# Patient Record
Sex: Female | Born: 1945
Health system: Southern US, Community
[De-identification: ages and names within clinical notes are randomized; demographics above are authoritative.]

## PROBLEM LIST (undated history)

## (undated) DIAGNOSIS — M1990 Unspecified osteoarthritis, unspecified site: Secondary | ICD-10-CM

## (undated) DIAGNOSIS — F419 Anxiety disorder, unspecified: Secondary | ICD-10-CM

## (undated) DIAGNOSIS — E7800 Pure hypercholesterolemia, unspecified: Secondary | ICD-10-CM

## (undated) DIAGNOSIS — E039 Hypothyroidism, unspecified: Secondary | ICD-10-CM

## (undated) DIAGNOSIS — K219 Gastro-esophageal reflux disease without esophagitis: Secondary | ICD-10-CM

## (undated) DIAGNOSIS — I10 Essential (primary) hypertension: Secondary | ICD-10-CM

## (undated) HISTORY — PX: ABDOMINAL HYSTERECTOMY: SHX81

---

## 1999-10-04 ENCOUNTER — Encounter: Admission: RE | Admit: 1999-10-04 | Discharge: 1999-10-04 | Payer: Self-pay | Admitting: Internal Medicine

## 2000-10-14 ENCOUNTER — Encounter: Admission: RE | Admit: 2000-10-14 | Discharge: 2000-10-14 | Payer: Self-pay | Admitting: Internal Medicine

## 2000-10-14 ENCOUNTER — Encounter: Payer: Self-pay | Admitting: Internal Medicine

## 2001-04-16 ENCOUNTER — Encounter: Admission: RE | Admit: 2001-04-16 | Discharge: 2001-04-16 | Payer: Self-pay | Admitting: Internal Medicine

## 2001-04-16 ENCOUNTER — Encounter: Payer: Self-pay | Admitting: Internal Medicine

## 2001-11-25 ENCOUNTER — Encounter: Admission: RE | Admit: 2001-11-25 | Discharge: 2001-11-25 | Payer: Self-pay | Admitting: Internal Medicine

## 2001-11-25 ENCOUNTER — Encounter: Payer: Self-pay | Admitting: Internal Medicine

## 2002-06-29 ENCOUNTER — Encounter: Admission: RE | Admit: 2002-06-29 | Discharge: 2002-06-29 | Payer: Self-pay | Admitting: Internal Medicine

## 2002-06-29 ENCOUNTER — Encounter: Payer: Self-pay | Admitting: Internal Medicine

## 2003-01-04 ENCOUNTER — Encounter: Admission: RE | Admit: 2003-01-04 | Discharge: 2003-01-04 | Payer: Self-pay | Admitting: Internal Medicine

## 2003-01-04 ENCOUNTER — Encounter: Payer: Self-pay | Admitting: Internal Medicine

## 2003-12-15 ENCOUNTER — Ambulatory Visit (HOSPITAL_COMMUNITY): Admission: RE | Admit: 2003-12-15 | Discharge: 2003-12-15 | Payer: Self-pay | Admitting: Internal Medicine

## 2004-02-14 ENCOUNTER — Encounter: Admission: RE | Admit: 2004-02-14 | Discharge: 2004-02-14 | Payer: Self-pay | Admitting: Internal Medicine

## 2005-02-14 ENCOUNTER — Encounter: Admission: RE | Admit: 2005-02-14 | Discharge: 2005-02-14 | Payer: Self-pay | Admitting: Internal Medicine

## 2005-11-28 ENCOUNTER — Emergency Department (HOSPITAL_COMMUNITY): Admission: EM | Admit: 2005-11-28 | Discharge: 2005-11-29 | Payer: Self-pay | Admitting: Emergency Medicine

## 2006-01-24 ENCOUNTER — Encounter: Admission: RE | Admit: 2006-01-24 | Discharge: 2006-01-24 | Payer: Self-pay | Admitting: Internal Medicine

## 2006-02-19 ENCOUNTER — Encounter: Admission: RE | Admit: 2006-02-19 | Discharge: 2006-02-19 | Payer: Self-pay | Admitting: Internal Medicine

## 2007-02-04 ENCOUNTER — Encounter: Admission: RE | Admit: 2007-02-04 | Discharge: 2007-02-04 | Payer: Self-pay | Admitting: Family Medicine

## 2007-02-20 ENCOUNTER — Encounter: Admission: RE | Admit: 2007-02-20 | Discharge: 2007-02-20 | Payer: Self-pay | Admitting: Family Medicine

## 2007-04-06 ENCOUNTER — Other Ambulatory Visit: Admission: RE | Admit: 2007-04-06 | Discharge: 2007-04-06 | Payer: Self-pay | Admitting: Family Medicine

## 2008-02-26 ENCOUNTER — Encounter: Admission: RE | Admit: 2008-02-26 | Discharge: 2008-02-26 | Payer: Self-pay | Admitting: Family Medicine

## 2008-08-16 ENCOUNTER — Ambulatory Visit (HOSPITAL_COMMUNITY): Admission: RE | Admit: 2008-08-16 | Discharge: 2008-08-16 | Payer: Self-pay | Admitting: Gastroenterology

## 2008-08-16 ENCOUNTER — Encounter (INDEPENDENT_AMBULATORY_CARE_PROVIDER_SITE_OTHER): Payer: Self-pay | Admitting: Gastroenterology

## 2009-02-27 ENCOUNTER — Encounter: Admission: RE | Admit: 2009-02-27 | Discharge: 2009-02-27 | Payer: Self-pay | Admitting: Family Medicine

## 2009-06-27 ENCOUNTER — Other Ambulatory Visit: Admission: RE | Admit: 2009-06-27 | Discharge: 2009-06-27 | Payer: Self-pay | Admitting: Family Medicine

## 2009-07-04 ENCOUNTER — Encounter: Admission: RE | Admit: 2009-07-04 | Discharge: 2009-07-04 | Payer: Self-pay | Admitting: Family Medicine

## 2010-03-13 ENCOUNTER — Encounter: Admission: RE | Admit: 2010-03-13 | Discharge: 2010-03-13 | Payer: Self-pay | Admitting: Family Medicine

## 2011-02-12 ENCOUNTER — Other Ambulatory Visit: Payer: Self-pay | Admitting: Family Medicine

## 2011-02-12 DIAGNOSIS — Z1231 Encounter for screening mammogram for malignant neoplasm of breast: Secondary | ICD-10-CM

## 2011-03-15 ENCOUNTER — Ambulatory Visit
Admission: RE | Admit: 2011-03-15 | Discharge: 2011-03-15 | Disposition: A | Discharge status: Home / Self Care | Payer: Self-pay | Source: Ambulatory Visit | Attending: Family Medicine | Admitting: Family Medicine

## 2011-03-15 DIAGNOSIS — Z1231 Encounter for screening mammogram for malignant neoplasm of breast: Secondary | ICD-10-CM

## 2012-02-17 ENCOUNTER — Other Ambulatory Visit: Payer: Self-pay | Admitting: Family Medicine

## 2012-02-17 DIAGNOSIS — Z1231 Encounter for screening mammogram for malignant neoplasm of breast: Secondary | ICD-10-CM

## 2012-03-18 ENCOUNTER — Ambulatory Visit: Payer: 59

## 2012-03-20 ENCOUNTER — Ambulatory Visit
Admission: RE | Admit: 2012-03-20 | Discharge: 2012-03-20 | Disposition: A | Discharge status: Home / Self Care | Payer: BC Managed Care – PPO | Source: Ambulatory Visit | Attending: Family Medicine | Admitting: Family Medicine

## 2012-03-20 DIAGNOSIS — Z1231 Encounter for screening mammogram for malignant neoplasm of breast: Secondary | ICD-10-CM

## 2013-02-24 ENCOUNTER — Other Ambulatory Visit: Payer: Self-pay

## 2013-02-24 DIAGNOSIS — Z1231 Encounter for screening mammogram for malignant neoplasm of breast: Secondary | ICD-10-CM

## 2013-03-29 ENCOUNTER — Ambulatory Visit
Admission: RE | Admit: 2013-03-29 | Discharge: 2013-03-29 | Disposition: A | Discharge status: Home / Self Care | Payer: Medicare Other | Source: Ambulatory Visit

## 2013-03-29 DIAGNOSIS — Z1231 Encounter for screening mammogram for malignant neoplasm of breast: Secondary | ICD-10-CM

## 2014-03-02 ENCOUNTER — Other Ambulatory Visit: Payer: Self-pay

## 2014-03-02 DIAGNOSIS — Z1231 Encounter for screening mammogram for malignant neoplasm of breast: Secondary | ICD-10-CM

## 2014-03-30 ENCOUNTER — Ambulatory Visit: Payer: Medicare Other

## 2014-04-04 ENCOUNTER — Ambulatory Visit
Admission: RE | Admit: 2014-04-04 | Discharge: 2014-04-04 | Disposition: A | Discharge status: Home / Self Care | Payer: Medicare Other | Source: Ambulatory Visit

## 2014-04-04 DIAGNOSIS — Z1231 Encounter for screening mammogram for malignant neoplasm of breast: Secondary | ICD-10-CM

## 2015-03-01 ENCOUNTER — Other Ambulatory Visit: Payer: Self-pay

## 2015-03-01 DIAGNOSIS — Z1231 Encounter for screening mammogram for malignant neoplasm of breast: Secondary | ICD-10-CM

## 2015-04-07 ENCOUNTER — Ambulatory Visit
Admission: RE | Admit: 2015-04-07 | Discharge: 2015-04-07 | Disposition: A | Discharge status: Home / Self Care | Payer: Self-pay | Source: Ambulatory Visit

## 2015-04-07 DIAGNOSIS — Z1231 Encounter for screening mammogram for malignant neoplasm of breast: Secondary | ICD-10-CM

## 2016-01-04 DIAGNOSIS — M859 Disorder of bone density and structure, unspecified: Secondary | ICD-10-CM | POA: Diagnosis not present

## 2016-01-04 DIAGNOSIS — M8589 Other specified disorders of bone density and structure, multiple sites: Secondary | ICD-10-CM | POA: Diagnosis not present

## 2016-02-20 DIAGNOSIS — H1045 Other chronic allergic conjunctivitis: Secondary | ICD-10-CM | POA: Diagnosis not present

## 2016-03-20 ENCOUNTER — Other Ambulatory Visit: Payer: Self-pay

## 2016-03-20 DIAGNOSIS — Z1231 Encounter for screening mammogram for malignant neoplasm of breast: Secondary | ICD-10-CM

## 2016-04-11 ENCOUNTER — Ambulatory Visit
Admission: RE | Admit: 2016-04-11 | Discharge: 2016-04-11 | Disposition: A | Discharge status: Home / Self Care | Payer: Medicare HMO | Source: Ambulatory Visit

## 2016-04-11 DIAGNOSIS — Z1231 Encounter for screening mammogram for malignant neoplasm of breast: Secondary | ICD-10-CM

## 2016-05-06 DIAGNOSIS — L0390 Cellulitis, unspecified: Secondary | ICD-10-CM | POA: Diagnosis not present

## 2016-05-06 DIAGNOSIS — T148 Other injury of unspecified body region: Secondary | ICD-10-CM | POA: Diagnosis not present

## 2016-06-04 DIAGNOSIS — N183 Chronic kidney disease, stage 3 (moderate): Secondary | ICD-10-CM | POA: Diagnosis not present

## 2016-06-04 DIAGNOSIS — I129 Hypertensive chronic kidney disease with stage 1 through stage 4 chronic kidney disease, or unspecified chronic kidney disease: Secondary | ICD-10-CM | POA: Diagnosis not present

## 2016-06-04 DIAGNOSIS — T148 Other injury of unspecified body region: Secondary | ICD-10-CM | POA: Diagnosis not present

## 2016-06-04 DIAGNOSIS — E7800 Pure hypercholesterolemia, unspecified: Secondary | ICD-10-CM | POA: Diagnosis not present

## 2016-06-04 DIAGNOSIS — E041 Nontoxic single thyroid nodule: Secondary | ICD-10-CM | POA: Diagnosis not present

## 2016-06-04 DIAGNOSIS — R7309 Other abnormal glucose: Secondary | ICD-10-CM | POA: Diagnosis not present

## 2016-06-04 DIAGNOSIS — R7303 Prediabetes: Secondary | ICD-10-CM | POA: Diagnosis not present

## 2016-08-13 DIAGNOSIS — B9689 Other specified bacterial agents as the cause of diseases classified elsewhere: Secondary | ICD-10-CM | POA: Diagnosis not present

## 2016-08-13 DIAGNOSIS — X32XXXD Exposure to sunlight, subsequent encounter: Secondary | ICD-10-CM | POA: Diagnosis not present

## 2016-08-13 DIAGNOSIS — L02425 Furuncle of right lower limb: Secondary | ICD-10-CM | POA: Diagnosis not present

## 2016-08-13 DIAGNOSIS — L708 Other acne: Secondary | ICD-10-CM | POA: Diagnosis not present

## 2016-08-13 DIAGNOSIS — L570 Actinic keratosis: Secondary | ICD-10-CM | POA: Diagnosis not present

## 2016-08-19 DIAGNOSIS — L500 Allergic urticaria: Secondary | ICD-10-CM | POA: Diagnosis not present

## 2016-08-22 DIAGNOSIS — M71371 Other bursal cyst, right ankle and foot: Secondary | ICD-10-CM | POA: Diagnosis not present

## 2016-08-22 DIAGNOSIS — M7130 Other bursal cyst, unspecified site: Secondary | ICD-10-CM | POA: Diagnosis not present

## 2016-08-22 DIAGNOSIS — B0229 Other postherpetic nervous system involvement: Secondary | ICD-10-CM | POA: Diagnosis not present

## 2017-01-14 DIAGNOSIS — R7303 Prediabetes: Secondary | ICD-10-CM | POA: Diagnosis not present

## 2017-01-14 DIAGNOSIS — E7800 Pure hypercholesterolemia, unspecified: Secondary | ICD-10-CM | POA: Diagnosis not present

## 2017-01-14 DIAGNOSIS — E041 Nontoxic single thyroid nodule: Secondary | ICD-10-CM | POA: Diagnosis not present

## 2017-01-14 DIAGNOSIS — I129 Hypertensive chronic kidney disease with stage 1 through stage 4 chronic kidney disease, or unspecified chronic kidney disease: Secondary | ICD-10-CM | POA: Diagnosis not present

## 2017-01-14 DIAGNOSIS — R69 Illness, unspecified: Secondary | ICD-10-CM | POA: Diagnosis not present

## 2017-01-14 DIAGNOSIS — N183 Chronic kidney disease, stage 3 (moderate): Secondary | ICD-10-CM | POA: Diagnosis not present

## 2017-01-14 DIAGNOSIS — J069 Acute upper respiratory infection, unspecified: Secondary | ICD-10-CM | POA: Diagnosis not present

## 2017-02-11 DIAGNOSIS — B009 Herpesviral infection, unspecified: Secondary | ICD-10-CM | POA: Diagnosis not present

## 2017-02-11 DIAGNOSIS — E7800 Pure hypercholesterolemia, unspecified: Secondary | ICD-10-CM | POA: Diagnosis not present

## 2017-02-11 DIAGNOSIS — I10 Essential (primary) hypertension: Secondary | ICD-10-CM | POA: Diagnosis not present

## 2017-02-11 DIAGNOSIS — Z6826 Body mass index (BMI) 26.0-26.9, adult: Secondary | ICD-10-CM | POA: Diagnosis not present

## 2017-02-11 DIAGNOSIS — Z0000 Encounter for general adult medical examination without abnormal findings: Secondary | ICD-10-CM | POA: Diagnosis not present

## 2017-02-11 DIAGNOSIS — E038 Other specified hypothyroidism: Secondary | ICD-10-CM | POA: Diagnosis not present

## 2017-02-11 DIAGNOSIS — R69 Illness, unspecified: Secondary | ICD-10-CM | POA: Diagnosis not present

## 2017-02-25 DIAGNOSIS — D225 Melanocytic nevi of trunk: Secondary | ICD-10-CM | POA: Diagnosis not present

## 2017-02-25 DIAGNOSIS — L308 Other specified dermatitis: Secondary | ICD-10-CM | POA: Diagnosis not present

## 2017-02-25 DIAGNOSIS — Z1283 Encounter for screening for malignant neoplasm of skin: Secondary | ICD-10-CM | POA: Diagnosis not present

## 2017-02-25 DIAGNOSIS — X32XXXD Exposure to sunlight, subsequent encounter: Secondary | ICD-10-CM | POA: Diagnosis not present

## 2017-02-25 DIAGNOSIS — L570 Actinic keratosis: Secondary | ICD-10-CM | POA: Diagnosis not present

## 2017-03-14 ENCOUNTER — Other Ambulatory Visit: Payer: Self-pay | Admitting: Family Medicine

## 2017-03-14 DIAGNOSIS — Z1231 Encounter for screening mammogram for malignant neoplasm of breast: Secondary | ICD-10-CM

## 2017-04-18 ENCOUNTER — Ambulatory Visit
Admission: RE | Admit: 2017-04-18 | Discharge: 2017-04-18 | Disposition: A | Discharge status: Home / Self Care | Payer: Medicare HMO | Source: Ambulatory Visit | Attending: Family Medicine | Admitting: Family Medicine

## 2017-04-18 DIAGNOSIS — Z1231 Encounter for screening mammogram for malignant neoplasm of breast: Secondary | ICD-10-CM

## 2017-04-22 DIAGNOSIS — M7130 Other bursal cyst, unspecified site: Secondary | ICD-10-CM | POA: Diagnosis not present

## 2017-04-22 DIAGNOSIS — S20361A Insect bite (nonvenomous) of right front wall of thorax, initial encounter: Secondary | ICD-10-CM | POA: Diagnosis not present

## 2017-08-14 DIAGNOSIS — E041 Nontoxic single thyroid nodule: Secondary | ICD-10-CM | POA: Diagnosis not present

## 2017-08-14 DIAGNOSIS — E7800 Pure hypercholesterolemia, unspecified: Secondary | ICD-10-CM | POA: Diagnosis not present

## 2017-08-14 DIAGNOSIS — B009 Herpesviral infection, unspecified: Secondary | ICD-10-CM | POA: Diagnosis not present

## 2017-08-14 DIAGNOSIS — M859 Disorder of bone density and structure, unspecified: Secondary | ICD-10-CM | POA: Diagnosis not present

## 2017-08-14 DIAGNOSIS — R69 Illness, unspecified: Secondary | ICD-10-CM | POA: Diagnosis not present

## 2017-08-14 DIAGNOSIS — Z0000 Encounter for general adult medical examination without abnormal findings: Secondary | ICD-10-CM | POA: Diagnosis not present

## 2017-08-14 DIAGNOSIS — I129 Hypertensive chronic kidney disease with stage 1 through stage 4 chronic kidney disease, or unspecified chronic kidney disease: Secondary | ICD-10-CM | POA: Diagnosis not present

## 2017-08-14 DIAGNOSIS — R7303 Prediabetes: Secondary | ICD-10-CM | POA: Diagnosis not present

## 2017-08-14 DIAGNOSIS — E559 Vitamin D deficiency, unspecified: Secondary | ICD-10-CM | POA: Diagnosis not present

## 2017-08-14 DIAGNOSIS — N183 Chronic kidney disease, stage 3 (moderate): Secondary | ICD-10-CM | POA: Diagnosis not present

## 2017-09-09 DIAGNOSIS — Z1283 Encounter for screening for malignant neoplasm of skin: Secondary | ICD-10-CM | POA: Diagnosis not present

## 2017-09-09 DIAGNOSIS — D225 Melanocytic nevi of trunk: Secondary | ICD-10-CM | POA: Diagnosis not present

## 2017-09-09 DIAGNOSIS — X32XXXD Exposure to sunlight, subsequent encounter: Secondary | ICD-10-CM | POA: Diagnosis not present

## 2017-09-09 DIAGNOSIS — L570 Actinic keratosis: Secondary | ICD-10-CM | POA: Diagnosis not present

## 2018-01-28 DIAGNOSIS — R69 Illness, unspecified: Secondary | ICD-10-CM | POA: Diagnosis not present

## 2018-01-28 DIAGNOSIS — I10 Essential (primary) hypertension: Secondary | ICD-10-CM | POA: Diagnosis not present

## 2018-01-28 DIAGNOSIS — H04129 Dry eye syndrome of unspecified lacrimal gland: Secondary | ICD-10-CM | POA: Diagnosis not present

## 2018-01-28 DIAGNOSIS — E785 Hyperlipidemia, unspecified: Secondary | ICD-10-CM | POA: Diagnosis not present

## 2018-01-28 DIAGNOSIS — Z8249 Family history of ischemic heart disease and other diseases of the circulatory system: Secondary | ICD-10-CM | POA: Diagnosis not present

## 2018-01-28 DIAGNOSIS — Z881 Allergy status to other antibiotic agents status: Secondary | ICD-10-CM | POA: Diagnosis not present

## 2018-01-28 DIAGNOSIS — E039 Hypothyroidism, unspecified: Secondary | ICD-10-CM | POA: Diagnosis not present

## 2018-02-09 DIAGNOSIS — H2512 Age-related nuclear cataract, left eye: Secondary | ICD-10-CM | POA: Diagnosis not present

## 2018-02-09 DIAGNOSIS — H1852 Epithelial (juvenile) corneal dystrophy: Secondary | ICD-10-CM | POA: Diagnosis not present

## 2018-02-09 DIAGNOSIS — H2513 Age-related nuclear cataract, bilateral: Secondary | ICD-10-CM | POA: Diagnosis not present

## 2018-02-09 DIAGNOSIS — H40013 Open angle with borderline findings, low risk, bilateral: Secondary | ICD-10-CM | POA: Diagnosis not present

## 2018-02-20 NOTE — Patient Instructions (Signed)
DAN DISSINGER  02/20/2018     @PREFPERIOPPHARMACY @   Your procedure is scheduled on 03/02/2018.  Report to [facility] at 10:00 A.M.  Call this number if you have problems the morning of surgery:  [phone]   Remember:  Do not eat food or drink liquids after midnight.  Take these medicines the morning of surgery with A SIP OF WATER Celexa, Synthroid, Metoprolol   Do not wear jewelry, make-up or nail polish.  Do not wear lotions, powders, or perfumes, or deodorant.  Do not shave 48 hours prior to surgery.  Men may shave face and neck.  Do not bring valuables to the hospital.  [facility] is not responsible for any belongings or valuables.  Contacts, dentures or bridgework may not be worn into surgery.  Leave your suitcase in the car.  After surgery it may be brought to your room.  For patients admitted to the hospital, discharge time will be determined by your treatment team.  Patients discharged the day of surgery will not be allowed to drive home.    Please read over the following fact sheets that you were given. Anesthesia Post-op Instructions     PATIENT INSTRUCTIONS POST-ANESTHESIA  IMMEDIATELY FOLLOWING SURGERY:  Do not drive or operate machinery for the first twenty four hours after surgery.  Do not make any important decisions for twenty four hours after surgery or while taking narcotic pain medications or sedatives.  If you develop intractable nausea and vomiting or a severe headache please notify your doctor immediately.  FOLLOW-UP:  Please make an appointment with your surgeon as instructed. You do not need to follow up with anesthesia unless specifically instructed to do so.  WOUND CARE INSTRUCTIONS (if applicable):  Keep a dry clean dressing on the anesthesia/puncture wound site if there is drainage.  Once the wound has quit draining you may leave it open to air.  Generally you should leave the bandage intact for twenty four hours unless there is drainage.  If  the epidural site drains for more than 36-48 hours please call the anesthesia department.  QUESTIONS?:  Please feel free to call your physician or the hospital operator if you have any questions, and they will be happy to assist you.      Cataract Surgery Cataract surgery is a procedure to remove a cataract from your eye. A cataract is cloudiness on the lens of your eye. The lens focuses light inside the eye. When a lens becomes cloudy, your vision is affected. Cataract surgery is a procedure to remove the cloudy lens. A substitute lens (intraocular lens or IOL) is usually inserted as a replacement for the cloudy lens. Tell a health care provider about:  Any allergies you have.  All medicines you are taking, including vitamins, herbs, eye drops, creams, and over-the-counter medicines.  Any problems you or family members have had with anesthetic medicines.  Any blood disorders you have.  Any surgeries you have had, especially eye surgeries that include refractive surgery, such as PRK and LASIK.  Any medical conditions you have.  Whether you are pregnant or may be pregnant. What are the risks? Generally, this is a safe procedure. However, problems may occur, including:  Infection.  Bleeding.  Glaucoma.  Retinal detachment.  Allergic reactions to medicines.  Damage to other structures or organs.  Inflammation of the eye.  Clouding of the part of your eye that holds an IOL in place (after-cataract), if an IOL was inserted. This is fairly  common.  An IOL moving out of position, if an IOL was inserted. This is very rare.  Loss of vision. This is rare.  What happens before the procedure?  Follow instructions from your health care provider about eating or drinking restrictions.  Ask your health care provider about: ? Changing or stopping your regular medicines, including any eye drops you have been prescribed. This is especially important if you are taking diabetes medicines  or blood thinners. ? Taking medicines such as aspirin and ibuprofen. These medicines can thin your blood. Do not take these medicines before your procedure if your health care provider instructs you not to.  Do not put contact lenses in either eye on the day of your surgery.  Plan for someone to drive you to and from the procedure.  If you will be going home right after the procedure, plan to have someone with you for 24 hours. What happens during the procedure?  An IV tube may be inserted into one of your veins.  You will be given one or more of the following: ? A medicine to help you relax (sedative). ? A medicine to numb the area (local anesthetic). This may be numbing eye drops or an injection that is given behind the eye.  A small cut (incision) will be made to the edge of the clear, dome-shaped surface that covers the front of the eye (cornea).  A small probe will be inserted into the eye. This device gives off ultrasound waves that soften and break up the cloudy center of the lens. This makes it easier for the cloudy lens to be removed by suction.  An IOL may be implanted.  Part of the capsule that surrounds the lens will be left in the eye to support the IOL.  Your surgeon may use stitches (sutures) to close the incision. The procedure may vary among health care providers and hospitals. What happens after the procedure?  Your blood pressure, heart rate, breathing rate, and blood oxygen level will be monitored often until the medicines you were given have worn off.  You may be given a protective shield to wear over your eyes.  Do not drive for 24 hours if you received a sedative. This information is not intended to replace advice given to you by your health care provider. Make sure you discuss any questions you have with your health care provider. Document Released: 11/28/2011 Document Revised: 05/16/2016 Document Reviewed: 10/19/2015 Elsevier Interactive Patient Education   [employer].

## 2018-02-24 ENCOUNTER — Encounter (HOSPITAL_COMMUNITY)
Admission: RE | Admit: 2018-02-24 | Discharge: 2018-02-24 | Disposition: A | Discharge status: Home / Self Care | Payer: Medicare HMO | Source: Ambulatory Visit | Attending: Ophthalmology | Admitting: Ophthalmology

## 2018-02-24 ENCOUNTER — Other Ambulatory Visit: Payer: Self-pay

## 2018-02-24 ENCOUNTER — Encounter (HOSPITAL_COMMUNITY): Payer: Self-pay

## 2018-02-24 DIAGNOSIS — Z01812 Encounter for preprocedural laboratory examination: Secondary | ICD-10-CM | POA: Insufficient documentation

## 2018-02-24 DIAGNOSIS — Z01810 Encounter for preprocedural cardiovascular examination: Secondary | ICD-10-CM | POA: Insufficient documentation

## 2018-02-24 HISTORY — DX: Pure hypercholesterolemia, unspecified: E78.00

## 2018-02-24 HISTORY — DX: Unspecified osteoarthritis, unspecified site: M19.90

## 2018-02-24 HISTORY — DX: Anxiety disorder, unspecified: F41.9

## 2018-02-24 HISTORY — DX: Hypothyroidism, unspecified: E03.9

## 2018-02-24 HISTORY — DX: Essential (primary) hypertension: I10

## 2018-02-24 LAB — CBC WITH DIFFERENTIAL/PLATELET
Basophils Absolute: 0 10*3/uL (ref 0.0–0.1)
Basophils Relative: 1 %
EOS PCT: 3 %
Eosinophils Absolute: 0.2 10*3/uL (ref 0.0–0.7)
HCT: 38.7 % (ref 36.0–46.0)
Hemoglobin: 12.3 g/dL (ref 12.0–15.0)
LYMPHS ABS: 1.7 10*3/uL (ref 0.7–4.0)
LYMPHS PCT: 30 %
MCH: 30.7 pg (ref 26.0–34.0)
MCHC: 31.8 g/dL (ref 30.0–36.0)
MCV: 96.5 fL (ref 78.0–100.0)
MONO ABS: 0.6 10*3/uL (ref 0.1–1.0)
MONOS PCT: 10 %
Neutro Abs: 3.3 10*3/uL (ref 1.7–7.7)
Neutrophils Relative %: 56 %
PLATELETS: 254 10*3/uL (ref 150–400)
RBC: 4.01 MIL/uL (ref 3.87–5.11)
RDW: 12.7 % (ref 11.5–15.5)
WBC: 5.8 10*3/uL (ref 4.0–10.5)

## 2018-02-24 LAB — BASIC METABOLIC PANEL
Anion gap: 12 (ref 5–15)
BUN: 18 mg/dL (ref 6–20)
CO2: 26 mmol/L (ref 22–32)
Calcium: 9.5 mg/dL (ref 8.9–10.3)
Chloride: 98 mmol/L — ABNORMAL LOW (ref 101–111)
Creatinine, Ser: 0.86 mg/dL (ref 0.44–1.00)
GFR calc Af Amer: >60 mL/min (ref >60)
GLUCOSE: 90 mg/dL (ref 65–99)
POTASSIUM: 3.4 mmol/L — AB (ref 3.5–5.1)
Sodium: 136 mmol/L (ref 135–145)

## 2018-03-02 ENCOUNTER — Ambulatory Visit (HOSPITAL_COMMUNITY): Payer: Medicare HMO | Admitting: Anesthesiology

## 2018-03-02 ENCOUNTER — Encounter (HOSPITAL_COMMUNITY): Payer: Self-pay | Admitting: *Deleted

## 2018-03-02 ENCOUNTER — Encounter (HOSPITAL_COMMUNITY)
Admission: RE | Disposition: A | Discharge status: Home / Self Care | Payer: Self-pay | Source: Ambulatory Visit | Attending: Ophthalmology

## 2018-03-02 ENCOUNTER — Ambulatory Visit (HOSPITAL_COMMUNITY)
Admission: RE | Admit: 2018-03-02 | Discharge: 2018-03-02 | Disposition: A | Discharge status: Home / Self Care | Payer: Medicare HMO | Source: Ambulatory Visit | Attending: Ophthalmology | Admitting: Ophthalmology

## 2018-03-02 DIAGNOSIS — M1990 Unspecified osteoarthritis, unspecified site: Secondary | ICD-10-CM | POA: Insufficient documentation

## 2018-03-02 DIAGNOSIS — H2512 Age-related nuclear cataract, left eye: Secondary | ICD-10-CM | POA: Insufficient documentation

## 2018-03-02 DIAGNOSIS — I10 Essential (primary) hypertension: Secondary | ICD-10-CM | POA: Insufficient documentation

## 2018-03-02 DIAGNOSIS — R69 Illness, unspecified: Secondary | ICD-10-CM | POA: Diagnosis not present

## 2018-03-02 DIAGNOSIS — F419 Anxiety disorder, unspecified: Secondary | ICD-10-CM | POA: Insufficient documentation

## 2018-03-02 DIAGNOSIS — Z79890 Hormone replacement therapy (postmenopausal): Secondary | ICD-10-CM | POA: Insufficient documentation

## 2018-03-02 DIAGNOSIS — E7800 Pure hypercholesterolemia, unspecified: Secondary | ICD-10-CM | POA: Insufficient documentation

## 2018-03-02 DIAGNOSIS — Z79899 Other long term (current) drug therapy: Secondary | ICD-10-CM | POA: Diagnosis not present

## 2018-03-02 DIAGNOSIS — E039 Hypothyroidism, unspecified: Secondary | ICD-10-CM | POA: Diagnosis not present

## 2018-03-02 HISTORY — PX: CATARACT EXTRACTION W/PHACO: SHX586

## 2018-03-02 SURGERY — PHACOEMULSIFICATION, CATARACT, WITH IOL INSERTION
Anesthesia: Monitor Anesthesia Care | Site: Eye | Laterality: Left

## 2018-03-02 MED ORDER — BSS IO SOLN
INTRAOCULAR | Status: DC | PRN
  Administered 2018-03-02: 15 mL

## 2018-03-02 MED ORDER — LACTATED RINGERS IV SOLN
INTRAVENOUS | Status: DC
  Administered 2018-03-02: 10:00:00 via INTRAVENOUS

## 2018-03-02 MED ORDER — FENTANYL CITRATE (PF) 100 MCG/2ML IJ SOLN
25.0000 ug | Freq: Once | INTRAMUSCULAR | Status: AC
  Administered 2018-03-02: 25 ug via INTRAVENOUS
  Filled 2018-03-02: qty 2

## 2018-03-02 MED ORDER — LIDOCAINE HCL (PF) 1 % IJ SOLN
INTRAMUSCULAR | Status: DC | PRN
  Administered 2018-03-02: .6 mL

## 2018-03-02 MED ORDER — NEOMYCIN-POLYMYXIN-DEXAMETH 3.5-10000-0.1 OP SUSP
OPHTHALMIC | Status: DC | PRN
  Administered 2018-03-02: 2 [drp] via OPHTHALMIC

## 2018-03-02 MED ORDER — MIDAZOLAM HCL 2 MG/2ML IJ SOLN
1.0000 mg | INTRAMUSCULAR | Status: AC
  Administered 2018-03-02: 2 mg via INTRAVENOUS
  Filled 2018-03-02: qty 2

## 2018-03-02 MED ORDER — POVIDONE-IODINE 5 % OP SOLN
OPHTHALMIC | Status: DC | PRN
  Administered 2018-03-02: 1 via OPHTHALMIC

## 2018-03-02 MED ORDER — TETRACAINE HCL 0.5 % OP SOLN
1.0000 [drp] | OPHTHALMIC | Status: AC
  Administered 2018-03-02 (×3): 1 [drp] via OPHTHALMIC

## 2018-03-02 MED ORDER — CYCLOPENTOLATE-PHENYLEPHRINE 0.2-1 % OP SOLN
1.0000 [drp] | OPHTHALMIC | Status: AC
  Administered 2018-03-02 (×3): 1 [drp] via OPHTHALMIC

## 2018-03-02 MED ORDER — PROVISC 10 MG/ML IO SOLN
INTRAOCULAR | Status: DC | PRN
  Administered 2018-03-02: 0.85 mL via INTRAOCULAR

## 2018-03-02 MED ORDER — LIDOCAINE HCL 3.5 % OP GEL
1.0000 "application " | Freq: Once | OPHTHALMIC | Status: AC
  Administered 2018-03-02: 1 via OPHTHALMIC

## 2018-03-02 MED ORDER — PHENYLEPHRINE HCL 2.5 % OP SOLN
1.0000 [drp] | OPHTHALMIC | Status: AC
  Administered 2018-03-02 (×3): 1 [drp] via OPHTHALMIC

## 2018-03-02 MED ORDER — EPINEPHRINE PF 1 MG/ML IJ SOLN
INTRAOCULAR | Status: DC | PRN
  Administered 2018-03-02: 500 mL

## 2018-03-02 SURGICAL SUPPLY — 13 items
CAPSULAR TENSION RING-AMO (OPHTHALMIC RELATED) ×1 IMPLANT
CLOTH BEACON ORANGE TIMEOUT ST (SAFETY) ×1 IMPLANT
EYE SHIELD UNIVERSAL CLEAR (GAUZE/BANDAGES/DRESSINGS) ×1 IMPLANT
GLOVE BIOGEL PI IND STRL 6.5 (GLOVE) IMPLANT
GLOVE BIOGEL PI IND STRL 7.0 (GLOVE) IMPLANT
GLOVE BIOGEL PI INDICATOR 6.5 (GLOVE) ×1
GLOVE BIOGEL PI INDICATOR 7.0 (GLOVE) ×1
LENS ALC ACRYL/TECN (Ophthalmic Related) ×1 IMPLANT
PAD ARMBOARD 7.5X6 YLW CONV (MISCELLANEOUS) ×1 IMPLANT
SYRINGE LUER LOK 1CC (MISCELLANEOUS) ×1 IMPLANT
TAPE SURG TRANSPORE 1 IN (GAUZE/BANDAGES/DRESSINGS) IMPLANT
TAPE SURGICAL TRANSPORE 1 IN (GAUZE/BANDAGES/DRESSINGS) ×1
WATER STERILE IRR 250ML POUR (IV SOLUTION) ×2 IMPLANT

## 2018-03-02 NOTE — H&P (Signed)
I have reviewed the H&P, the patient was re-examined, and I have identified no interval changes in medical condition and plan of care since the history and physical of record  

## 2018-03-02 NOTE — Anesthesia Postprocedure Evaluation (Signed)
Anesthesia Post Note  Patient: Natalie Holder  Procedure(s) Performed: CATARACT EXTRACTION PHACO AND INTRAOCULAR LENS PLACEMENT ([place]) (Left Eye)  Patient location during evaluation: Short Stay Anesthesia Type: MAC Level of consciousness: awake and alert and patient cooperative Pain management: pain level controlled Vital Signs Assessment: post-procedure vital signs reviewed and stable Respiratory status: spontaneous breathing Cardiovascular status: stable Postop Assessment: no apparent nausea or vomiting Anesthetic complications: no     Last Vitals:  Vitals:   03/02/18 1050 03/02/18 1055  BP: 102/60   Pulse:    Resp: (!) 29 (!) 38  Temp:    SpO2: 100% 100%    Last Pain:  Vitals:   03/02/18 1019  TempSrc: Oral                 Jamai Dolce

## 2018-03-02 NOTE — Discharge Instructions (Signed)

## 2018-03-02 NOTE — Anesthesia Preprocedure Evaluation (Signed)
Anesthesia Evaluation  Patient identified by MRN, date of birth, ID band Patient awake    Reviewed: Allergy & Precautions, NPO status , Patient's Chart, lab work & pertinent test results  Airway Mallampati: II  TM Distance: >3 FB Neck ROM: Full    Dental  (+) Teeth Intact, Partial Lower   Pulmonary neg pulmonary ROS,    breath sounds clear to auscultation       Cardiovascular hypertension, Pt. on medications  Rhythm:Regular Rate:Normal     Neuro/Psych PSYCHIATRIC DISORDERS Anxiety negative neurological ROS     GI/Hepatic   Endo/Other  Hypothyroidism   Renal/GU      Musculoskeletal  (+) Arthritis ,   Abdominal   Peds  Hematology   Anesthesia Other Findings   Reproductive/Obstetrics                             Anesthesia Physical Anesthesia Plan  ASA: II  Anesthesia Plan: MAC   Post-op Pain Management:    Induction: Intravenous  PONV Risk Score and Plan:   Airway Management Planned: Nasal Cannula  Additional Equipment:   Intra-op Plan:   Post-operative Plan:   Informed Consent: I have reviewed the patients History and Physical, chart, labs and discussed the procedure including the risks, benefits and alternatives for the proposed anesthesia with the patient or authorized representative who has indicated his/her understanding and acceptance.     Plan Discussed with:   Anesthesia Plan Comments:         Anesthesia Quick Evaluation

## 2018-03-02 NOTE — Anesthesia Procedure Notes (Signed)
Procedure Name: MAC Date/Time: 03/02/2018 10:59 AM Performed by: Vista Deck, CRNA Pre-anesthesia Checklist: Patient identified, Emergency Drugs available, Suction available, Timeout performed and Patient being monitored Patient Re-evaluated:Patient Re-evaluated prior to induction Oxygen Delivery Method: Nasal Cannula

## 2018-03-02 NOTE — Op Note (Signed)
Date of Admission: 03/02/2018  Date of Surgery: 03/02/2018  Pre-Op Dx: Cataract Left  Eye  Post-Op Dx: Senile Nuclear Cataract  Left  Eye,  Dx Code H25.12  Surgeon: Tonny Branch, M.D.  Assistants: None  Anesthesia: Topical with MAC  Indications: Painless, progressive loss of vision with compromise of daily activities.  Surgery: Cataract Extraction with Intraocular lens Implant Left Eye  Discription: The patient had dilating drops and viscous lidocaine placed into the Left eye in the pre-op holding area. After transfer to the operating room, a time out was performed. The patient was then prepped and draped. Beginning with a 10m blade a paracentesis port was made at the surgeon's 2 o'clock position. The anterior chamber was then filled with 1% non-preserved lidocaine. This was followed by filling the anterior chamber with Provisc.  A 2.430mkeratome blade was used to make a clear corneal incision at the temporal limbus.  A bent cystatome needle was used to create a continuous tear capsulotomy. Hydrodissection was performed with balanced salt solution on a Fine canula. The lens nucleus was then removed using the phacoemulsification handpiece. Residual cortex was removed with the I&A handpiece. The anterior chamber and capsular bag were refilled with Provisc. A posterior chamber intraocular lens was placed into the capsular bag with it's injector. The implant was positioned with the Kuglan hook. The Provisc was then removed from the anterior chamber and capsular bag with the I&A handpiece. Stromal hydration of the main incision and paracentesis port was performed with BSS on a Fine canula. The wounds were tested for leak which was negative. The patient tolerated the procedure well. There were no operative complications. The patient was then transferred to the recovery room in stable condition.  Complications: None  Specimen: None  EBL: None  Prosthetic device: Abbott Technis, PCB00, power 21.0, SN  [id number]

## 2018-03-02 NOTE — Transfer of Care (Signed)
Immediate Anesthesia Transfer of Care Note  Patient: Natalie Holder  Procedure(s) Performed: CATARACT EXTRACTION PHACO AND INTRAOCULAR LENS PLACEMENT ([facility]) (Left Eye)  Patient Location: Short Stay  Anesthesia Type:MAC  Level of Consciousness: awake, alert  and patient cooperative  Airway & Oxygen Therapy: Patient Spontanous Breathing  Post-op Assessment: Report given to RN and Post -op Vital signs reviewed and stable  Post vital signs: Reviewed and stable  Last Vitals:  Vitals:   03/02/18 1050 03/02/18 1055  BP: 102/60   Pulse:    Resp: (!) 29 (!) 38  Temp:    SpO2: 100% 100%    Last Pain:  Vitals:   03/02/18 1019  TempSrc: Oral      Patients Stated Pain Goal: 5 (99/83/38 2505)  Complications: No apparent anesthesia complications

## 2018-03-03 ENCOUNTER — Encounter (HOSPITAL_COMMUNITY): Payer: Self-pay | Admitting: Ophthalmology

## 2018-03-09 DIAGNOSIS — H2511 Age-related nuclear cataract, right eye: Secondary | ICD-10-CM | POA: Diagnosis not present

## 2018-03-11 ENCOUNTER — Encounter (HOSPITAL_COMMUNITY)
Admission: RE | Admit: 2018-03-11 | Discharge: 2018-03-11 | Disposition: A | Discharge status: Home / Self Care | Payer: Medicare HMO | Source: Ambulatory Visit | Attending: Ophthalmology | Admitting: Ophthalmology

## 2018-03-11 ENCOUNTER — Encounter (HOSPITAL_COMMUNITY): Payer: Self-pay

## 2018-03-16 ENCOUNTER — Ambulatory Visit (HOSPITAL_COMMUNITY): Payer: Medicare HMO | Admitting: Anesthesiology

## 2018-03-16 ENCOUNTER — Encounter (HOSPITAL_COMMUNITY): Payer: Self-pay

## 2018-03-16 ENCOUNTER — Ambulatory Visit (HOSPITAL_COMMUNITY)
Admission: RE | Admit: 2018-03-16 | Discharge: 2018-03-16 | Disposition: A | Discharge status: Home / Self Care | Payer: Medicare HMO | Source: Ambulatory Visit | Attending: Ophthalmology | Admitting: Ophthalmology

## 2018-03-16 ENCOUNTER — Encounter (HOSPITAL_COMMUNITY)
Admission: RE | Disposition: A | Discharge status: Home / Self Care | Payer: Self-pay | Source: Ambulatory Visit | Attending: Ophthalmology

## 2018-03-16 DIAGNOSIS — Z888 Allergy status to other drugs, medicaments and biological substances status: Secondary | ICD-10-CM | POA: Diagnosis not present

## 2018-03-16 DIAGNOSIS — F419 Anxiety disorder, unspecified: Secondary | ICD-10-CM | POA: Diagnosis not present

## 2018-03-16 DIAGNOSIS — Z881 Allergy status to other antibiotic agents status: Secondary | ICD-10-CM | POA: Diagnosis not present

## 2018-03-16 DIAGNOSIS — Z79890 Hormone replacement therapy (postmenopausal): Secondary | ICD-10-CM | POA: Diagnosis not present

## 2018-03-16 DIAGNOSIS — E7800 Pure hypercholesterolemia, unspecified: Secondary | ICD-10-CM | POA: Diagnosis not present

## 2018-03-16 DIAGNOSIS — R69 Illness, unspecified: Secondary | ICD-10-CM | POA: Diagnosis not present

## 2018-03-16 DIAGNOSIS — Z79899 Other long term (current) drug therapy: Secondary | ICD-10-CM | POA: Insufficient documentation

## 2018-03-16 DIAGNOSIS — E039 Hypothyroidism, unspecified: Secondary | ICD-10-CM | POA: Insufficient documentation

## 2018-03-16 DIAGNOSIS — H2511 Age-related nuclear cataract, right eye: Secondary | ICD-10-CM | POA: Insufficient documentation

## 2018-03-16 DIAGNOSIS — M1990 Unspecified osteoarthritis, unspecified site: Secondary | ICD-10-CM | POA: Insufficient documentation

## 2018-03-16 DIAGNOSIS — I10 Essential (primary) hypertension: Secondary | ICD-10-CM | POA: Insufficient documentation

## 2018-03-16 HISTORY — PX: CATARACT EXTRACTION W/PHACO: SHX586

## 2018-03-16 SURGERY — PHACOEMULSIFICATION, CATARACT, WITH IOL INSERTION
Anesthesia: Monitor Anesthesia Care | Site: Eye | Laterality: Right

## 2018-03-16 MED ORDER — FENTANYL CITRATE (PF) 100 MCG/2ML IJ SOLN
INTRAMUSCULAR | Status: AC
  Filled 2018-03-16: qty 2

## 2018-03-16 MED ORDER — LACTATED RINGERS IV SOLN
INTRAVENOUS | Status: DC
  Administered 2018-03-16: 08:00:00 via INTRAVENOUS

## 2018-03-16 MED ORDER — POVIDONE-IODINE 5 % OP SOLN
OPHTHALMIC | Status: DC | PRN
  Administered 2018-03-16: 1 via OPHTHALMIC

## 2018-03-16 MED ORDER — LIDOCAINE HCL 3.5 % OP GEL
1.0000 "application " | Freq: Once | OPHTHALMIC | Status: AC
  Administered 2018-03-16: 1 via OPHTHALMIC

## 2018-03-16 MED ORDER — CYCLOPENTOLATE-PHENYLEPHRINE 0.2-1 % OP SOLN
1.0000 [drp] | OPHTHALMIC | Status: AC
  Administered 2018-03-16 (×3): 1 [drp] via OPHTHALMIC

## 2018-03-16 MED ORDER — MIDAZOLAM HCL 2 MG/2ML IJ SOLN
INTRAMUSCULAR | Status: AC
  Filled 2018-03-16: qty 2

## 2018-03-16 MED ORDER — MIDAZOLAM HCL 2 MG/2ML IJ SOLN
1.0000 mg | INTRAMUSCULAR | Status: AC
  Administered 2018-03-16: 2 mg via INTRAVENOUS

## 2018-03-16 MED ORDER — TETRACAINE HCL 0.5 % OP SOLN
1.0000 [drp] | OPHTHALMIC | Status: AC
  Administered 2018-03-16 (×3): 1 [drp] via OPHTHALMIC

## 2018-03-16 MED ORDER — PROVISC 10 MG/ML IO SOLN
INTRAOCULAR | Status: DC | PRN
  Administered 2018-03-16: 0.85 mL via INTRAOCULAR

## 2018-03-16 MED ORDER — PHENYLEPHRINE HCL 2.5 % OP SOLN
1.0000 [drp] | OPHTHALMIC | Status: AC
  Administered 2018-03-16 (×3): 1 [drp] via OPHTHALMIC

## 2018-03-16 MED ORDER — LIDOCAINE HCL (PF) 1 % IJ SOLN
INTRAMUSCULAR | Status: DC | PRN
  Administered 2018-03-16: .5 mL

## 2018-03-16 MED ORDER — BSS IO SOLN
INTRAOCULAR | Status: DC | PRN
  Administered 2018-03-16: 15 mL

## 2018-03-16 MED ORDER — NEOMYCIN-POLYMYXIN-DEXAMETH 3.5-10000-0.1 OP SUSP
OPHTHALMIC | Status: DC | PRN
  Administered 2018-03-16: 2 [drp] via OPHTHALMIC

## 2018-03-16 MED ORDER — FENTANYL CITRATE (PF) 100 MCG/2ML IJ SOLN
25.0000 ug | Freq: Once | INTRAMUSCULAR | Status: AC
  Administered 2018-03-16: 25 ug via INTRAVENOUS

## 2018-03-16 MED ORDER — EPINEPHRINE PF 1 MG/ML IJ SOLN
INTRAOCULAR | Status: DC | PRN
  Administered 2018-03-16: 500 mL

## 2018-03-16 SURGICAL SUPPLY — 12 items
CLOTH BEACON ORANGE TIMEOUT ST (SAFETY) ×1 IMPLANT
EYE SHIELD UNIVERSAL CLEAR (GAUZE/BANDAGES/DRESSINGS) ×1 IMPLANT
GLOVE BIOGEL PI IND STRL 6.5 (GLOVE) IMPLANT
GLOVE BIOGEL PI IND STRL 7.0 (GLOVE) IMPLANT
GLOVE BIOGEL PI INDICATOR 6.5 (GLOVE) ×1
GLOVE BIOGEL PI INDICATOR 7.0 (GLOVE) ×1
LENS ALC ACRYL/TECN (Ophthalmic Related) ×1 IMPLANT
PAD ARMBOARD 7.5X6 YLW CONV (MISCELLANEOUS) ×1 IMPLANT
SYRINGE LUER LOK 1CC (MISCELLANEOUS) ×1 IMPLANT
TAPE SURG TRANSPORE 1 IN (GAUZE/BANDAGES/DRESSINGS) IMPLANT
TAPE SURGICAL TRANSPORE 1 IN (GAUZE/BANDAGES/DRESSINGS) ×1
WATER STERILE IRR 250ML POUR (IV SOLUTION) ×1 IMPLANT

## 2018-03-16 NOTE — Discharge Instructions (Signed)

## 2018-03-16 NOTE — Anesthesia Preprocedure Evaluation (Signed)
Anesthesia Evaluation  Patient identified by MRN, date of birth, ID band Patient awake    Reviewed: Allergy & Precautions, NPO status , Patient's Chart, lab work & pertinent test results  Airway Mallampati: II  TM Distance: >3 FB Neck ROM: Full    Dental  (+) Teeth Intact, Partial Lower   Pulmonary neg pulmonary ROS,    breath sounds clear to auscultation       Cardiovascular hypertension, Pt. on medications  Rhythm:Regular Rate:Normal     Neuro/Psych PSYCHIATRIC DISORDERS Anxiety negative neurological ROS     GI/Hepatic   Endo/Other  Hypothyroidism   Renal/GU      Musculoskeletal  (+) Arthritis ,   Abdominal   Peds  Hematology   Anesthesia Other Findings   Reproductive/Obstetrics                             Anesthesia Physical Anesthesia Plan  ASA: II  Anesthesia Plan: MAC   Post-op Pain Management:    Induction: Intravenous  PONV Risk Score and Plan:   Airway Management Planned: Nasal Cannula  Additional Equipment:   Intra-op Plan:   Post-operative Plan:   Informed Consent: I have reviewed the patients History and Physical, chart, labs and discussed the procedure including the risks, benefits and alternatives for the proposed anesthesia with the patient or authorized representative who has indicated his/her understanding and acceptance.     Plan Discussed with:   Anesthesia Plan Comments:         Anesthesia Quick Evaluation

## 2018-03-16 NOTE — Op Note (Signed)
Date of Admission: 03/16/2018  Date of Surgery: 03/16/2018  Pre-Op Dx: Cataract Right  Eye  Post-Op Dx: Senile Nuclear Cataract  Right  Eye,  Dx Code H25.11  Surgeon: Tonny Branch, M.D.  Assistants: None  Anesthesia: Topical with MAC  Indications: Painless, progressive loss of vision with compromise of daily activities.  Surgery: Cataract Extraction with Intraocular lens Implant Right Eye  Discription: The patient had dilating drops and viscous lidocaine placed into the Right eye in the pre-op holding area. After transfer to the operating room, a time out was performed. The patient was then prepped and draped. Beginning with a 39m blade a paracentesis port was made at the surgeon's 2 o'clock position. The anterior chamber was then filled with 1% non-preserved lidocaine. This was followed by filling the anterior chamber with Provisc.  A 2.422mkeratome blade was used to make a clear corneal incision at the temporal limbus.  A bent cystatome needle was used to create a continuous tear capsulotomy. Hydrodissection was performed with balanced salt solution on a Fine canula. The lens nucleus was then removed using the phacoemulsification handpiece. Residual cortex was removed with the I&A handpiece. The anterior chamber and capsular bag were refilled with Provisc. A posterior chamber intraocular lens was placed into the capsular bag with it's injector. The implant was positioned with the Kuglan hook. The Provisc was then removed from the anterior chamber and capsular bag with the I&A handpiece. Stromal hydration of the main incision and paracentesis port was performed with BSS on a Fine canula. The wounds were tested for leak which was negative. The patient tolerated the procedure well. There were no operative complications. The patient was then transferred to the recovery room in stable condition.  Complications: None  Specimen: None  EBL: None  Prosthetic device: J&J Technis, PCB00, power 20.5, SN  [id number]

## 2018-03-16 NOTE — H&P (Signed)
I have reviewed the H&P, the patient was re-examined, and I have identified no interval changes in medical condition and plan of care since the history and physical of record  

## 2018-03-16 NOTE — Anesthesia Postprocedure Evaluation (Signed)
Anesthesia Post Note  Patient: Natalie Holder  Procedure(s) Performed: CATARACT EXTRACTION WITH PHACOEMULSIFICATION AND INTRAOCULAR LENS PLACEMENT RIGHT EYE (Right Eye)  Patient location during evaluation: Short Stay Anesthesia Type: MAC Level of consciousness: awake and alert and oriented Pain management: pain level controlled Vital Signs Assessment: post-procedure vital signs reviewed and stable Respiratory status: spontaneous breathing and respiratory function stable Cardiovascular status: stable Postop Assessment: no apparent nausea or vomiting Anesthetic complications: no     Last Vitals:  Vitals:   03/16/18 0835 03/16/18 0840  BP: (!) 101/54   Pulse:    Resp: (!) 63 (!) 60  Temp:    SpO2: 94% 99%    Last Pain:  Vitals:   03/16/18 0738  TempSrc: Oral  PainSc: 0-No pain                 Brynlei Klausner A

## 2018-03-16 NOTE — Transfer of Care (Signed)
Immediate Anesthesia Transfer of Care Note  Patient: Natalie Holder  Procedure(s) Performed: CATARACT EXTRACTION WITH PHACOEMULSIFICATION AND INTRAOCULAR LENS PLACEMENT RIGHT EYE (Right Eye)  Patient Location: Short Stay  Anesthesia Type:MAC  Level of Consciousness: awake, alert , oriented and patient cooperative  Airway & Oxygen Therapy: Patient Spontanous Breathing  Post-op Assessment: Report given to RN and Post -op Vital signs reviewed and stable  Post vital signs: Reviewed and stable  Last Vitals:  Vitals Value Taken Time  BP    Temp    Pulse    Resp    SpO2      Last Pain:  Vitals:   03/16/18 0738  TempSrc: Oral  PainSc: 0-No pain         Complications: No apparent anesthesia complications

## 2018-03-17 ENCOUNTER — Encounter (HOSPITAL_COMMUNITY): Payer: Self-pay | Admitting: Ophthalmology

## 2018-03-18 ENCOUNTER — Other Ambulatory Visit: Payer: Self-pay | Admitting: Family Medicine

## 2018-03-18 DIAGNOSIS — Z1231 Encounter for screening mammogram for malignant neoplasm of breast: Secondary | ICD-10-CM

## 2018-04-24 ENCOUNTER — Ambulatory Visit: Payer: Medicare HMO

## 2018-04-24 ENCOUNTER — Ambulatory Visit
Admission: RE | Admit: 2018-04-24 | Discharge: 2018-04-24 | Disposition: A | Discharge status: Home / Self Care | Payer: Medicare HMO | Source: Ambulatory Visit | Attending: Family Medicine | Admitting: Family Medicine

## 2018-04-24 DIAGNOSIS — Z1231 Encounter for screening mammogram for malignant neoplasm of breast: Secondary | ICD-10-CM | POA: Diagnosis not present

## 2018-04-29 DIAGNOSIS — L570 Actinic keratosis: Secondary | ICD-10-CM | POA: Diagnosis not present

## 2018-04-29 DIAGNOSIS — B078 Other viral warts: Secondary | ICD-10-CM | POA: Diagnosis not present

## 2018-04-29 DIAGNOSIS — L708 Other acne: Secondary | ICD-10-CM | POA: Diagnosis not present

## 2018-04-29 DIAGNOSIS — X32XXXD Exposure to sunlight, subsequent encounter: Secondary | ICD-10-CM | POA: Diagnosis not present

## 2018-10-06 DIAGNOSIS — N183 Chronic kidney disease, stage 3 (moderate): Secondary | ICD-10-CM | POA: Diagnosis not present

## 2018-10-06 DIAGNOSIS — R4189 Other symptoms and signs involving cognitive functions and awareness: Secondary | ICD-10-CM | POA: Diagnosis not present

## 2018-10-06 DIAGNOSIS — R69 Illness, unspecified: Secondary | ICD-10-CM | POA: Diagnosis not present

## 2018-10-06 DIAGNOSIS — I129 Hypertensive chronic kidney disease with stage 1 through stage 4 chronic kidney disease, or unspecified chronic kidney disease: Secondary | ICD-10-CM | POA: Diagnosis not present

## 2018-10-06 DIAGNOSIS — H6123 Impacted cerumen, bilateral: Secondary | ICD-10-CM | POA: Diagnosis not present

## 2018-10-06 DIAGNOSIS — R7303 Prediabetes: Secondary | ICD-10-CM | POA: Diagnosis not present

## 2018-10-06 DIAGNOSIS — Z0000 Encounter for general adult medical examination without abnormal findings: Secondary | ICD-10-CM | POA: Diagnosis not present

## 2018-10-06 DIAGNOSIS — N8110 Cystocele, unspecified: Secondary | ICD-10-CM | POA: Diagnosis not present

## 2018-10-06 DIAGNOSIS — E7800 Pure hypercholesterolemia, unspecified: Secondary | ICD-10-CM | POA: Diagnosis not present

## 2018-10-06 DIAGNOSIS — E041 Nontoxic single thyroid nodule: Secondary | ICD-10-CM | POA: Diagnosis not present

## 2019-04-08 DIAGNOSIS — Z1159 Encounter for screening for other viral diseases: Secondary | ICD-10-CM | POA: Diagnosis not present

## 2019-04-08 DIAGNOSIS — E7800 Pure hypercholesterolemia, unspecified: Secondary | ICD-10-CM | POA: Diagnosis not present

## 2019-04-08 DIAGNOSIS — I129 Hypertensive chronic kidney disease with stage 1 through stage 4 chronic kidney disease, or unspecified chronic kidney disease: Secondary | ICD-10-CM | POA: Diagnosis not present

## 2019-04-08 DIAGNOSIS — R7303 Prediabetes: Secondary | ICD-10-CM | POA: Diagnosis not present

## 2019-04-08 DIAGNOSIS — N183 Chronic kidney disease, stage 3 (moderate): Secondary | ICD-10-CM | POA: Diagnosis not present

## 2019-04-08 DIAGNOSIS — E559 Vitamin D deficiency, unspecified: Secondary | ICD-10-CM | POA: Diagnosis not present

## 2019-04-08 DIAGNOSIS — E041 Nontoxic single thyroid nodule: Secondary | ICD-10-CM | POA: Diagnosis not present

## 2019-06-08 DIAGNOSIS — M1712 Unilateral primary osteoarthritis, left knee: Secondary | ICD-10-CM | POA: Diagnosis not present

## 2019-06-08 DIAGNOSIS — M47816 Spondylosis without myelopathy or radiculopathy, lumbar region: Secondary | ICD-10-CM | POA: Diagnosis not present

## 2019-06-15 DIAGNOSIS — Z1159 Encounter for screening for other viral diseases: Secondary | ICD-10-CM | POA: Diagnosis not present

## 2019-06-15 DIAGNOSIS — E7800 Pure hypercholesterolemia, unspecified: Secondary | ICD-10-CM | POA: Diagnosis not present

## 2019-06-15 DIAGNOSIS — R7303 Prediabetes: Secondary | ICD-10-CM | POA: Diagnosis not present

## 2019-06-15 DIAGNOSIS — M545 Low back pain: Secondary | ICD-10-CM | POA: Diagnosis not present

## 2019-06-15 DIAGNOSIS — E041 Nontoxic single thyroid nodule: Secondary | ICD-10-CM | POA: Diagnosis not present

## 2019-06-29 DIAGNOSIS — M545 Low back pain: Secondary | ICD-10-CM | POA: Diagnosis not present

## 2019-06-29 DIAGNOSIS — M47816 Spondylosis without myelopathy or radiculopathy, lumbar region: Secondary | ICD-10-CM | POA: Diagnosis not present

## 2019-06-29 DIAGNOSIS — M25562 Pain in left knee: Secondary | ICD-10-CM | POA: Diagnosis not present

## 2019-06-29 DIAGNOSIS — M1712 Unilateral primary osteoarthritis, left knee: Secondary | ICD-10-CM | POA: Diagnosis not present

## 2019-07-06 DIAGNOSIS — M47816 Spondylosis without myelopathy or radiculopathy, lumbar region: Secondary | ICD-10-CM | POA: Diagnosis not present

## 2019-07-06 DIAGNOSIS — M545 Low back pain: Secondary | ICD-10-CM | POA: Diagnosis not present

## 2019-07-06 DIAGNOSIS — M48061 Spinal stenosis, lumbar region without neurogenic claudication: Secondary | ICD-10-CM | POA: Diagnosis not present

## 2019-07-13 DIAGNOSIS — M47816 Spondylosis without myelopathy or radiculopathy, lumbar region: Secondary | ICD-10-CM | POA: Diagnosis not present

## 2019-07-13 DIAGNOSIS — M48061 Spinal stenosis, lumbar region without neurogenic claudication: Secondary | ICD-10-CM | POA: Diagnosis not present

## 2019-07-13 DIAGNOSIS — M545 Low back pain: Secondary | ICD-10-CM | POA: Diagnosis not present

## 2019-07-22 DIAGNOSIS — M48061 Spinal stenosis, lumbar region without neurogenic claudication: Secondary | ICD-10-CM | POA: Diagnosis not present

## 2019-07-22 DIAGNOSIS — M47816 Spondylosis without myelopathy or radiculopathy, lumbar region: Secondary | ICD-10-CM | POA: Diagnosis not present

## 2019-07-22 DIAGNOSIS — M545 Low back pain: Secondary | ICD-10-CM | POA: Diagnosis not present

## 2019-07-27 DIAGNOSIS — M545 Low back pain: Secondary | ICD-10-CM | POA: Diagnosis not present

## 2019-07-27 DIAGNOSIS — M48061 Spinal stenosis, lumbar region without neurogenic claudication: Secondary | ICD-10-CM | POA: Diagnosis not present

## 2019-07-27 DIAGNOSIS — M47816 Spondylosis without myelopathy or radiculopathy, lumbar region: Secondary | ICD-10-CM | POA: Diagnosis not present

## 2019-08-09 DIAGNOSIS — L570 Actinic keratosis: Secondary | ICD-10-CM | POA: Diagnosis not present

## 2019-08-09 DIAGNOSIS — Z1283 Encounter for screening for malignant neoplasm of skin: Secondary | ICD-10-CM | POA: Diagnosis not present

## 2019-08-09 DIAGNOSIS — B078 Other viral warts: Secondary | ICD-10-CM | POA: Diagnosis not present

## 2019-08-09 DIAGNOSIS — D225 Melanocytic nevi of trunk: Secondary | ICD-10-CM | POA: Diagnosis not present

## 2019-08-09 DIAGNOSIS — X32XXXD Exposure to sunlight, subsequent encounter: Secondary | ICD-10-CM | POA: Diagnosis not present

## 2019-08-17 DIAGNOSIS — M545 Low back pain: Secondary | ICD-10-CM | POA: Diagnosis not present

## 2019-08-17 DIAGNOSIS — M47816 Spondylosis without myelopathy or radiculopathy, lumbar region: Secondary | ICD-10-CM | POA: Diagnosis not present

## 2019-11-30 DIAGNOSIS — E041 Nontoxic single thyroid nodule: Secondary | ICD-10-CM | POA: Diagnosis not present

## 2019-11-30 DIAGNOSIS — I129 Hypertensive chronic kidney disease with stage 1 through stage 4 chronic kidney disease, or unspecified chronic kidney disease: Secondary | ICD-10-CM | POA: Diagnosis not present

## 2019-11-30 DIAGNOSIS — R7303 Prediabetes: Secondary | ICD-10-CM | POA: Diagnosis not present

## 2019-11-30 DIAGNOSIS — M859 Disorder of bone density and structure, unspecified: Secondary | ICD-10-CM | POA: Diagnosis not present

## 2019-11-30 DIAGNOSIS — E7800 Pure hypercholesterolemia, unspecified: Secondary | ICD-10-CM | POA: Diagnosis not present

## 2019-11-30 DIAGNOSIS — R69 Illness, unspecified: Secondary | ICD-10-CM | POA: Diagnosis not present

## 2019-11-30 DIAGNOSIS — N183 Chronic kidney disease, stage 3 unspecified: Secondary | ICD-10-CM | POA: Diagnosis not present

## 2019-11-30 DIAGNOSIS — B009 Herpesviral infection, unspecified: Secondary | ICD-10-CM | POA: Diagnosis not present

## 2019-11-30 DIAGNOSIS — Z0000 Encounter for general adult medical examination without abnormal findings: Secondary | ICD-10-CM | POA: Diagnosis not present

## 2019-11-30 DIAGNOSIS — N8110 Cystocele, unspecified: Secondary | ICD-10-CM | POA: Diagnosis not present

## 2019-12-03 ENCOUNTER — Other Ambulatory Visit: Payer: Self-pay | Admitting: Family Medicine

## 2019-12-03 DIAGNOSIS — Z1231 Encounter for screening mammogram for malignant neoplasm of breast: Secondary | ICD-10-CM

## 2019-12-03 DIAGNOSIS — M810 Age-related osteoporosis without current pathological fracture: Secondary | ICD-10-CM

## 2020-03-02 ENCOUNTER — Ambulatory Visit
Admission: RE | Admit: 2020-03-02 | Discharge: 2020-03-02 | Disposition: A | Discharge status: Home / Self Care | Payer: Medicare HMO | Source: Ambulatory Visit | Attending: Family Medicine | Admitting: Family Medicine

## 2020-03-02 ENCOUNTER — Other Ambulatory Visit: Payer: Self-pay

## 2020-03-02 DIAGNOSIS — Z1231 Encounter for screening mammogram for malignant neoplasm of breast: Secondary | ICD-10-CM

## 2020-03-02 DIAGNOSIS — M810 Age-related osteoporosis without current pathological fracture: Secondary | ICD-10-CM

## 2020-04-04 IMAGING — MG DIGITAL SCREENING BILAT W/ TOMO W/ CAD
6 of 10 series · 6 of 30 positions shown · non-contrast
Comparison: Previous exam(s).

ACR Breast Density Category a: The breast tissue is almost entirely
fatty.

CLINICAL DATA: Screening.

EXAM:
DIGITAL SCREENING BILATERAL MAMMOGRAM WITH TOMO AND CAD

[R CC synth-2D (1 of 2)]
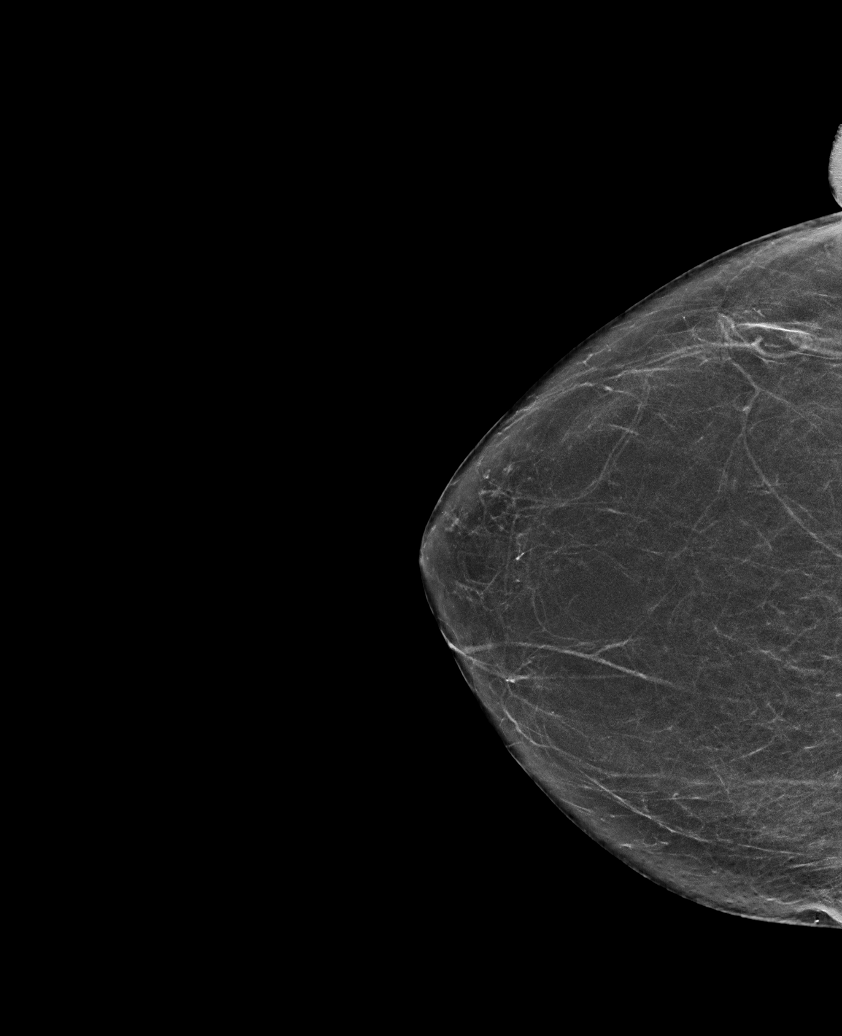

[R CC synth-2D (2 of 2)]
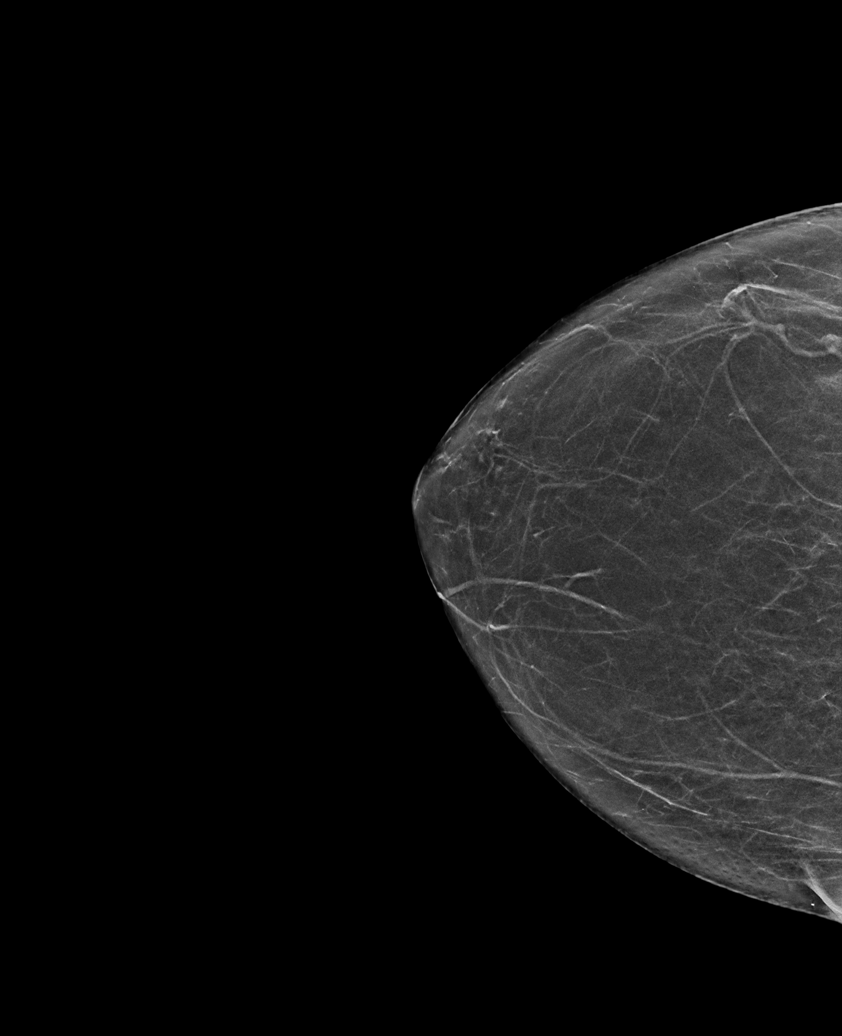

[L MLO synth-2D]
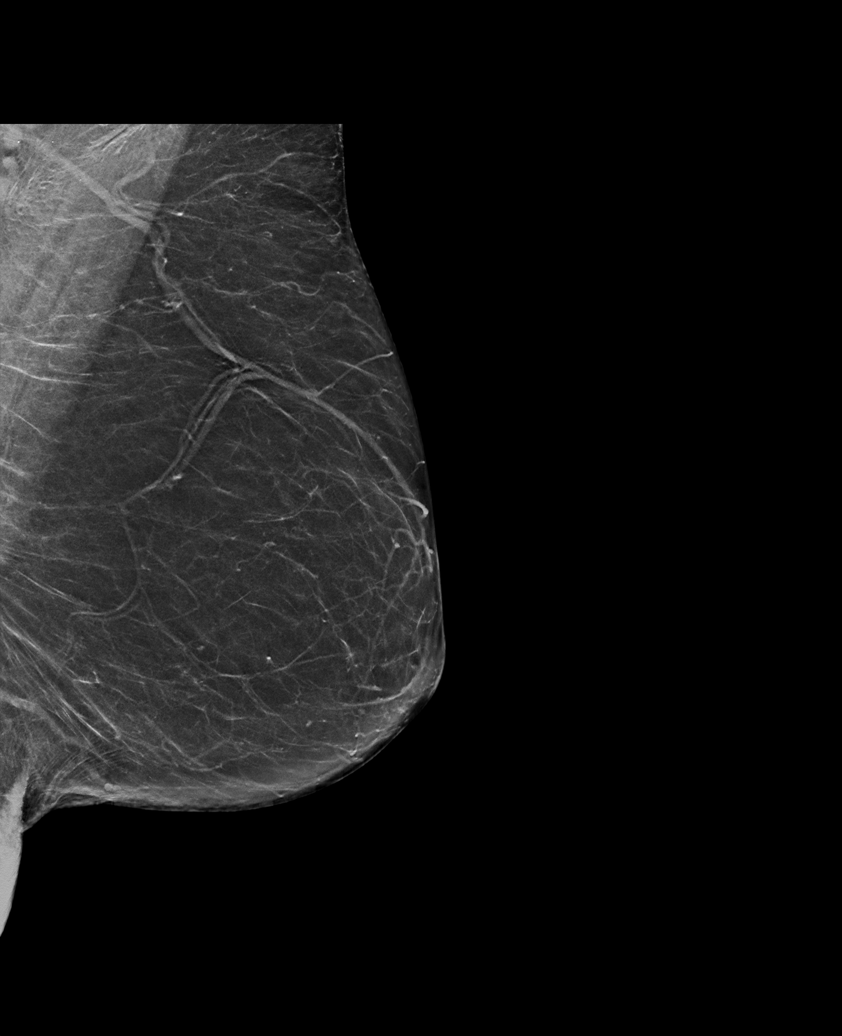

[L CC synth-2D]
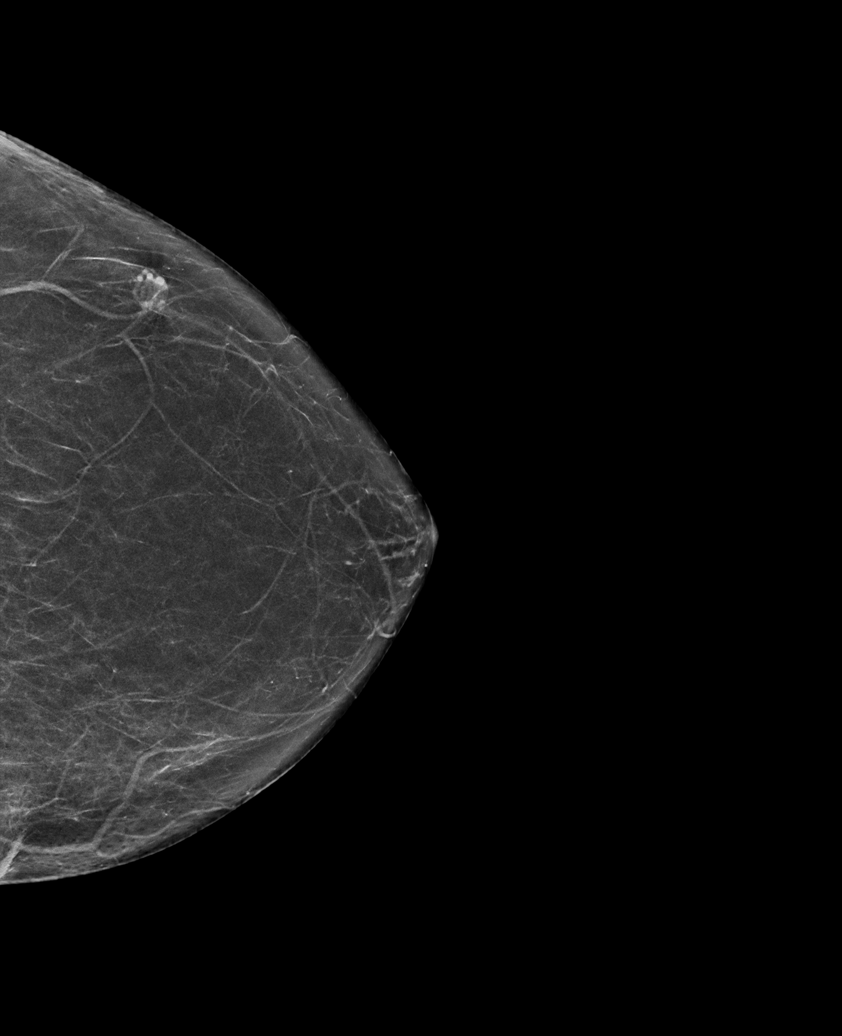

[R MLO synth-2D]
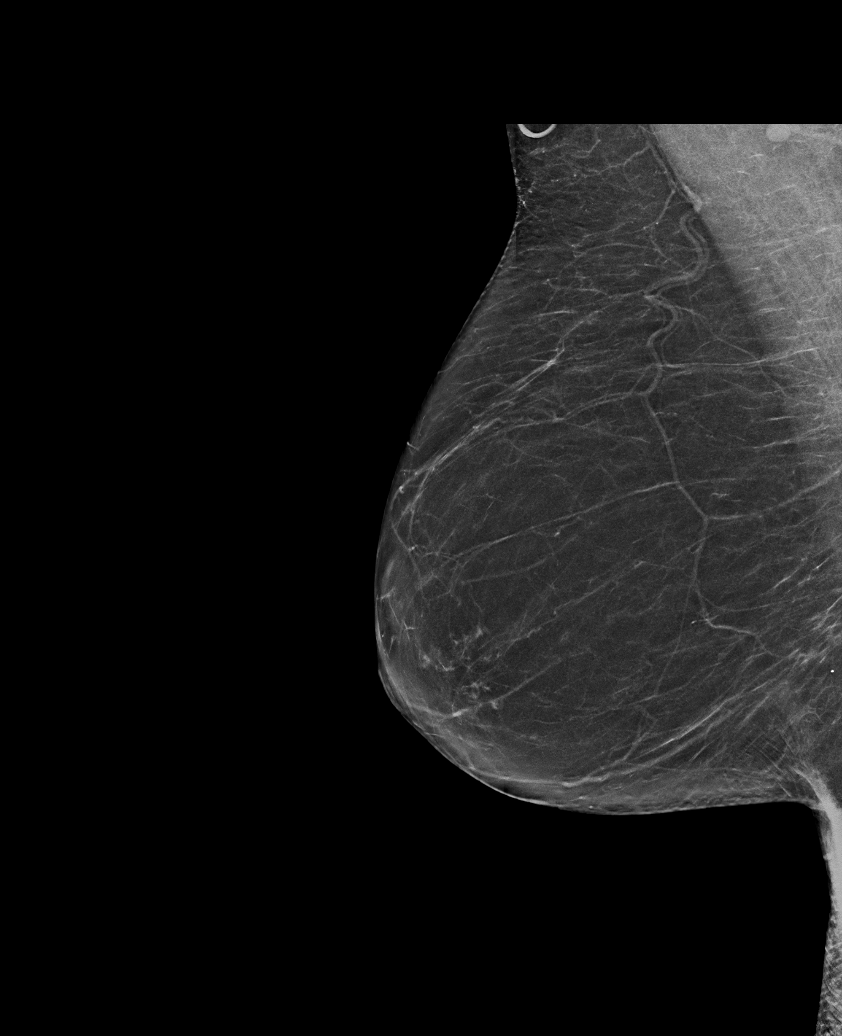

[L CC tomo · tomo slice 29/58.0]
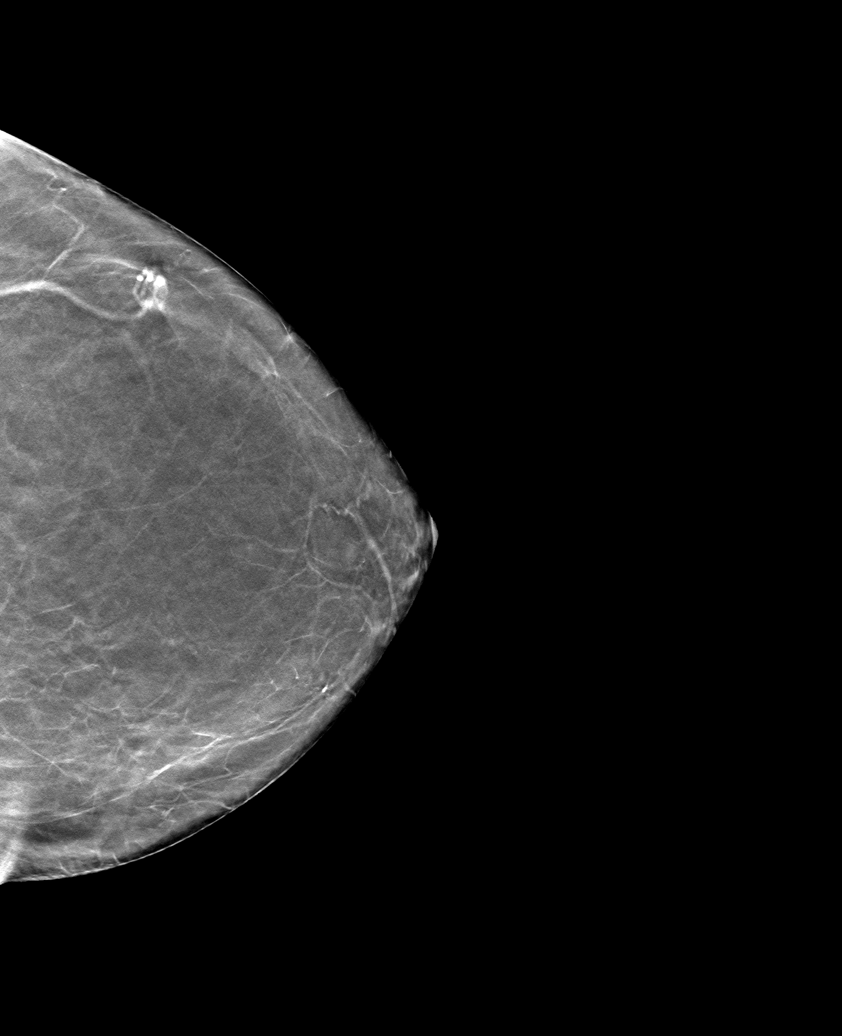

[6 of 30 positions shown; findings below may reference images not displayed]

FINDINGS: There are no findings suspicious for malignancy. Images were
processed with CAD.
IMPRESSION: No mammographic evidence of malignancy. A result letter of this
screening mammogram will be mailed directly to the patient.

RECOMMENDATION:
Screening mammogram in one year. (Code:[id number])

BI-RADS CATEGORY  1: Negative.

## 2020-10-12 ENCOUNTER — Other Ambulatory Visit: Payer: Self-pay

## 2020-10-12 ENCOUNTER — Other Ambulatory Visit: Payer: Medicare HMO

## 2020-10-12 DIAGNOSIS — Z20822 Contact with and (suspected) exposure to covid-19: Secondary | ICD-10-CM

## 2020-10-13 LAB — NOVEL CORONAVIRUS, NAA: SARS-CoV-2, NAA: NOT DETECTED

## 2020-10-13 LAB — SARS-COV-2, NAA 2 DAY TAT

## 2020-10-13 LAB — SPECIMEN STATUS REPORT

## 2021-01-16 ENCOUNTER — Other Ambulatory Visit: Payer: Medicare HMO

## 2021-01-16 ENCOUNTER — Other Ambulatory Visit: Payer: Self-pay

## 2021-01-16 DIAGNOSIS — Z20822 Contact with and (suspected) exposure to covid-19: Secondary | ICD-10-CM

## 2021-01-17 LAB — SARS-COV-2, NAA 2 DAY TAT

## 2021-01-17 LAB — NOVEL CORONAVIRUS, NAA: SARS-CoV-2, NAA: NOT DETECTED

## 2022-04-02 ENCOUNTER — Other Ambulatory Visit: Payer: Self-pay | Admitting: Family Medicine

## 2022-04-02 DIAGNOSIS — M859 Disorder of bone density and structure, unspecified: Secondary | ICD-10-CM

## 2022-07-10 ENCOUNTER — Other Ambulatory Visit: Payer: Self-pay | Admitting: Family Medicine

## 2022-07-10 DIAGNOSIS — M8588 Other specified disorders of bone density and structure, other site: Secondary | ICD-10-CM

## 2022-07-18 ENCOUNTER — Other Ambulatory Visit: Payer: Self-pay | Admitting: Family Medicine

## 2022-07-18 DIAGNOSIS — Z1231 Encounter for screening mammogram for malignant neoplasm of breast: Secondary | ICD-10-CM

## 2022-12-25 ENCOUNTER — Other Ambulatory Visit: Payer: Self-pay | Admitting: Family Medicine

## 2022-12-25 DIAGNOSIS — M8588 Other specified disorders of bone density and structure, other site: Secondary | ICD-10-CM

## 2022-12-30 ENCOUNTER — Ambulatory Visit
Admission: RE | Admit: 2022-12-30 | Discharge: 2022-12-30 | Disposition: A | Discharge status: Home / Self Care | Payer: Medicare HMO | Source: Ambulatory Visit | Attending: Family Medicine | Admitting: Family Medicine

## 2022-12-30 DIAGNOSIS — M8588 Other specified disorders of bone density and structure, other site: Secondary | ICD-10-CM

## 2022-12-30 DIAGNOSIS — Z1231 Encounter for screening mammogram for malignant neoplasm of breast: Secondary | ICD-10-CM

## 2023-01-17 ENCOUNTER — Other Ambulatory Visit: Payer: Self-pay | Admitting: Family Medicine

## 2023-01-17 DIAGNOSIS — E041 Nontoxic single thyroid nodule: Secondary | ICD-10-CM

## 2023-02-10 ENCOUNTER — Ambulatory Visit
Admission: RE | Admit: 2023-02-10 | Discharge: 2023-02-10 | Disposition: A | Discharge status: Home / Self Care | Payer: Medicare HMO | Source: Ambulatory Visit | Attending: Family Medicine | Admitting: Family Medicine

## 2023-02-10 DIAGNOSIS — E041 Nontoxic single thyroid nodule: Secondary | ICD-10-CM

## 2023-04-05 ENCOUNTER — Ambulatory Visit
Admission: RE | Admit: 2023-04-05 | Discharge: 2023-04-05 | Disposition: A | Discharge status: Home / Self Care | Payer: Medicare HMO | Source: Ambulatory Visit

## 2023-04-05 VITALS — BP 144/78 | HR 71 | Temp 97.5°F | Resp 18

## 2023-04-05 DIAGNOSIS — R059 Cough, unspecified: Secondary | ICD-10-CM | POA: Diagnosis not present

## 2023-04-05 DIAGNOSIS — J069 Acute upper respiratory infection, unspecified: Secondary | ICD-10-CM | POA: Diagnosis not present

## 2023-04-05 MED ORDER — BENZONATATE 100 MG PO CAPS: 100.0000 mg | ORAL_CAPSULE | Freq: Three times a day (TID) | ORAL | 0 refills | Status: DC | PRN

## 2023-04-05 MED ORDER — PREDNISONE 20 MG PO TABS: 20.0000 mg | ORAL_TABLET | Freq: Every day | ORAL | 0 refills | Status: AC

## 2023-04-05 MED ORDER — AZITHROMYCIN 250 MG PO TABS: 250.0000 mg | ORAL_TABLET | Freq: Every day | ORAL | 0 refills | Status: DC

## 2023-04-05 NOTE — ED Provider Notes (Signed)
[facility]    CSN: [id number] Arrival date & time: 04/05/23  1059      History   Chief Complaint Chief Complaint  Patient presents with   Cough    Congestion, cough, stuffy head, headache - Entered by patient    HPI Natalie Holder is a 77 y.o. female.   The history is provided by the patient.   The patient presents for complaints of productive cough, fatigue, intermittent fevers, indigestion, and abdominal pain.  Patient states that she has not had a fever in the past 24 hours.  She states that the cough is worse at night.  Patient denies wheezing or shortness of breath with the cough.  She denies chills, headache, sore throat, ear pain, wheezing, chest pain, nausea, vomiting, or diarrhea.  Patient reports that she had an inhaler that she used when she had RSV previously which did help her cough.  She also reports she has been taking Tylenol and Oscillococcinum.  Patient reports she took a home COVID test which was negative.  Past Medical History:  Diagnosis Date   Anxiety    Arthritis    Hypercholesteremia    Hypertension    Hypothyroidism     There are no problems to display for this patient.   Past Surgical History:  Procedure Laterality Date   ABDOMINAL HYSTERECTOMY     partial   CATARACT EXTRACTION W/PHACO Left 03/02/2018   Procedure: CATARACT EXTRACTION PHACO AND INTRAOCULAR LENS PLACEMENT (IOC);  Surgeon: Gemma Payor, MD;  Location: [facility];  Service: Ophthalmology;  Laterality: Left;  CDE: 6.23   CATARACT EXTRACTION W/PHACO Right 03/16/2018   Procedure: CATARACT EXTRACTION WITH PHACOEMULSIFICATION AND INTRAOCULAR LENS PLACEMENT RIGHT EYE;  Surgeon: Gemma Payor, MD;  Location: [facility];  Service: Ophthalmology;  Laterality: Right;  CDE: 6.85    OB History   No obstetric history on file.      Home Medications    Prior to Admission medications   Medication Sig Start Date End Date Taking? Authorizing Provider  azithromycin (ZITHROMAX) 250 MG tablet  Take 1 tablet (250 mg total) by mouth daily. Take first 2 tablets together, then 1 every day until finished. 04/05/23  Yes Cayman Kielbasa-Warren, Sadie Haber, NP  benzonatate (TESSALON PERLES) 100 MG capsule Take 1 capsule (100 mg total) by mouth 3 (three) times daily as needed for cough. 04/05/23  Yes Cheney Ewart-Warren, Sadie Haber, NP  predniSONE (DELTASONE) 20 MG tablet Take 1 tablet (20 mg total) by mouth daily with breakfast for 5 days. 04/05/23 04/10/23 Yes Vermell Madrid-Warren, Sadie Haber, NP  RESTASIS 0.05 % ophthalmic emulsion  02/17/23  Yes [provider]  Calcium-Phosphorus-Vitamin D (CITRACAL +D3 PO) Take 1 tablet by mouth every morning. Mid-Morning    [provider]  cholecalciferol (VITAMIN D) 1000 units tablet Take 1,000 Units by mouth every morning. Mid-Morning    [provider]  citalopram (CELEXA) 20 MG tablet Take 20 mg by mouth every morning. Mid-Morning    [provider]  levothyroxine (SYNTHROID, LEVOTHROID) 75 MCG tablet Take 75 mcg by mouth daily before breakfast.    [provider]  metoprolol tartrate (LOPRESSOR) 25 MG tablet Take 25 mg by mouth daily after supper.    [provider]  simvastatin (ZOCOR) 20 MG tablet Take 20 mg by mouth at bedtime.    [provider]  triamterene-hydrochlorothiazide (MAXZIDE-25) 37.5-25 MG tablet Take 1 tablet by mouth daily after breakfast.    [provider]  XIIDRA 5 % SOLN Place  1 drop into both eyes 2 (two) times daily. Morning & evening. 01/05/18   [provider]    Family History Family History  Problem Relation Age of Onset   Breast cancer Neg Hx     Social History Social History   Tobacco Use   Smoking status: Never   Smokeless tobacco: Never  Vaping Use   Vaping Use: Never used  Substance Use Topics   Alcohol use: No   Drug use: No     Allergies   Doxycycline and Tetracycline   Review of Systems Review of Systems Per HPI  Physical Exam Triage Vital  Signs ED Triage Vitals  Enc Vitals Group     BP 04/05/23 1104 (!) 144/78     Pulse Rate 04/05/23 1104 71     Resp 04/05/23 1104 18     Temp 04/05/23 1104 (!) 97.5 F (36.4 C)     Temp Source 04/05/23 1104 Oral     SpO2 04/05/23 1104 97 %     Weight --      Height --      Head Circumference --      Peak Flow --      Pain Score 04/05/23 1108 3     Pain Loc --      Pain Edu? --      Excl. in GC? --    No data found.  Updated Vital Signs BP (!) 144/78 (BP Location: Right Arm)   Pulse 71   Temp (!) 97.5 F (36.4 C) (Oral)   Resp 18   SpO2 97%   Visual Acuity Right Eye Distance:   Left Eye Distance:   Bilateral Distance:    Right Eye Near:   Left Eye Near:    Bilateral Near:     Physical Exam Vitals and nursing note reviewed.  Constitutional:      General: She is not in acute distress.    Appearance: Normal appearance.  HENT:     Head: Normocephalic.     Right Ear: Tympanic membrane, ear canal and external ear normal.     Left Ear: Tympanic membrane, ear canal and external ear normal.     Nose: Congestion present.     Right Turbinates: Enlarged and swollen.     Left Turbinates: Enlarged and swollen.     Right Sinus: No maxillary sinus tenderness or frontal sinus tenderness.     Left Sinus: No maxillary sinus tenderness or frontal sinus tenderness.     Mouth/Throat:     Mouth: Mucous membranes are moist.     Pharynx: Posterior oropharyngeal erythema present.     Comments: Cobblestoning present on posterior oropharynx Eyes:     Extraocular Movements: Extraocular movements intact.     Pupils: Pupils are equal, round, and reactive to light.  Cardiovascular:     Rate and Rhythm: Normal rate and regular rhythm.     Pulses: Normal pulses.     Heart sounds: Normal heart sounds.  Pulmonary:     Effort: Pulmonary effort is normal. No respiratory distress.     Breath sounds: Normal breath sounds. No stridor. No wheezing, rhonchi or rales.  Abdominal:     General:  Bowel sounds are normal.     Palpations: Abdomen is soft.     Tenderness: There is no abdominal tenderness.  Musculoskeletal:     Cervical back: Normal range of motion.  Lymphadenopathy:     Cervical: No cervical adenopathy.  Skin:    General: Skin  is warm and dry.  Neurological:     General: No focal deficit present.     Mental Status: She is alert and oriented to person, place, and time.  Psychiatric:        Mood and Affect: Mood normal.        Behavior: Behavior normal.      UC Treatments / Results  Labs (all labs ordered are listed, but only abnormal results are displayed) Labs Reviewed - No data to display  EKG   Radiology No results found.  Procedures Procedures (including critical care time)  Medications Ordered in UC Medications - No data to display  Initial Impression / Assessment and Plan / UC Course  I have reviewed the triage vital signs and the nursing notes.  Pertinent labs & imaging results that were available during my care of the patient were reviewed by me and considered in my medical decision making (see chart for details).  The patient is well-appearing, she is in no acute distress, vital signs are stable.  Patient presents with cough that is been present for more than 1 week.  She continues to complain of fatigue, cough is also productive.  Will treat empirically for an upper respiratory infection with azithromycin 250 mg.  Patient was also prescribed prednisone 20 mg to take daily along with Tessalon 100 mg for her cough.  Supportive care recommendations were provided and discussed with the patient to include use of Tylenol for pain or discomfort, increasing fluids, and allowing for plenty of rest.  Patient was given indications of when follow-up may be necessary.  Patient is in agreement with this plan of care and verbalizes understanding.  All questions were answered.  Patient stable for discharge.  Final Clinical Impressions(s) / UC Diagnoses    Final diagnoses:  Acute upper respiratory infection  Cough, unspecified type     Discharge Instructions      Take medication as prescribed. Increase fluids and allow for plenty of rest. Recommend over-the-counter Tylenol as needed for pain, fever, or general discomfort. Recommend using a humidifier in your bedroom at nighttime during sleep and sleeping elevated on pillows while cough symptoms persist. As discussed, if the cough continues to persist, but you are feeling well, recommend using throat lozenges and cough drops along with drinking plenty of water.  If you begin to feel worse such as fever, chills, wheezing, shortness of breath, or difficulty breathing, please follow-up in this clinic or with your primary care physician for further evaluation. Follow-up as needed.     ED Prescriptions     Medication Sig Dispense Auth. Provider   azithromycin (ZITHROMAX) 250 MG tablet Take 1 tablet (250 mg total) by mouth daily. Take first 2 tablets together, then 1 every day until finished. 6 tablet Clarke Peretz-Warren, Sadie Haber, NP   predniSONE (DELTASONE) 20 MG tablet Take 1 tablet (20 mg total) by mouth daily with breakfast for 5 days. 5 tablet Nyema Hachey-Warren, Sadie Haber, NP   benzonatate (TESSALON PERLES) 100 MG capsule Take 1 capsule (100 mg total) by mouth 3 (three) times daily as needed for cough. 30 capsule Daniell Mancinas-Warren, Sadie Haber, NP      PDMP not reviewed this encounter.   Abran Cantor, NP 04/05/23 1146

## 2023-04-05 NOTE — ED Triage Notes (Signed)
Pt reports reports she has been sick x 1 week. Has a cough, indigestion, some abdominal pain, atigue and slight fever on and of. Used inhaler, tylenol, and Oscillococcinum which gave slight relief.

## 2023-04-05 NOTE — Discharge Instructions (Signed)
Take medication as prescribed. Increase fluids and allow for plenty of rest. Recommend over-the-counter Tylenol as needed for pain, fever, or general discomfort. Recommend using a humidifier in your bedroom at nighttime during sleep and sleeping elevated on pillows while cough symptoms persist. As discussed, if the cough continues to persist, but you are feeling well, recommend using throat lozenges and cough drops along with drinking plenty of water.  If you begin to feel worse such as fever, chills, wheezing, shortness of breath, or difficulty breathing, please follow-up in this clinic or with your primary care physician for further evaluation. Follow-up as needed.

## 2023-05-01 ENCOUNTER — Ambulatory Visit
Admission: RE | Admit: 2023-05-01 | Discharge: 2023-05-01 | Disposition: A | Discharge status: Home / Self Care | Payer: Medicare HMO | Source: Ambulatory Visit

## 2023-05-01 ENCOUNTER — Other Ambulatory Visit: Payer: Self-pay

## 2023-05-01 DIAGNOSIS — M542 Cervicalgia: Secondary | ICD-10-CM

## 2023-05-01 HISTORY — DX: Gastro-esophageal reflux disease without esophagitis: K21.9

## 2023-05-01 MED ORDER — TIZANIDINE HCL 4 MG PO TABS: 4.0000 mg | ORAL_TABLET | Freq: Three times a day (TID) | ORAL | 0 refills | Status: DC | PRN
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
## 2024-05-10 ENCOUNTER — Ambulatory Visit: Attending: Internal Medicine | Admitting: Internal Medicine

## 2024-05-10 ENCOUNTER — Encounter: Payer: Self-pay | Admitting: Internal Medicine

## 2024-05-10 VITALS — BP 120/80 | HR 72 | Ht 67.0 in | Wt 163.0 lb

## 2024-05-10 DIAGNOSIS — I340 Nonrheumatic mitral (valve) insufficiency: Secondary | ICD-10-CM | POA: Insufficient documentation

## 2024-05-10 DIAGNOSIS — I071 Rheumatic tricuspid insufficiency: Secondary | ICD-10-CM | POA: Diagnosis not present

## 2024-05-10 DIAGNOSIS — I272 Pulmonary hypertension, unspecified: Secondary | ICD-10-CM | POA: Insufficient documentation

## 2024-05-10 DIAGNOSIS — E785 Hyperlipidemia, unspecified: Secondary | ICD-10-CM | POA: Insufficient documentation

## 2024-05-10 DIAGNOSIS — I10 Essential (primary) hypertension: Secondary | ICD-10-CM | POA: Diagnosis not present

## 2024-05-10 DIAGNOSIS — I350 Nonrheumatic aortic (valve) stenosis: Secondary | ICD-10-CM | POA: Diagnosis not present

## 2024-05-10 DIAGNOSIS — Z136 Encounter for screening for cardiovascular disorders: Secondary | ICD-10-CM

## 2024-05-10 DIAGNOSIS — E7849 Other hyperlipidemia: Secondary | ICD-10-CM

## 2024-05-10 NOTE — Patient Instructions (Signed)
 Medication Instructions:  Your physician recommends that you continue on your current medications as directed. Please refer to the Current Medication list given to you today.   Labwork: None  Testing/Procedures: Your physician has requested that you have an echocardiogram in a year. Echocardiography is a painless test that uses sound waves to create images of your heart. It provides your doctor with information about the size and shape of your heart and how well your heart's chambers and valves are working. This procedure takes approximately one hour. There are no restrictions for this procedure. Please do NOT wear cologne, perfume, aftershave, or lotions (deodorant is allowed). Please arrive 15 minutes prior to your appointment time.  Please note: We ask at that you not bring children with you during ultrasound (echo/ vascular) testing. Due to room size and safety concerns, children are not allowed in the ultrasound rooms during exams. Our front office staff cannot provide observation of children in our lobby area while testing is being conducted. An adult accompanying a patient to their appointment will only be allowed in the ultrasound room at the discretion of the ultrasound technician under special circumstances. We apologize for any inconvenience.   Follow-Up: Your physician recommends that you schedule a follow-up appointment in: 1 year. You will receive a reminder call in about 8 months reminding you to schedule your appointment. If you don't receive this call, please contact our office.   Any Other Special Instructions Will Be Listed Below (If Applicable).  If you need a refill on your cardiac medications before your next appointment, please call your pharmacy.

## 2024-05-10 NOTE — Progress Notes (Signed)
 Cardiology Office Note  Date: 05/10/2024   ID: Natalie Holder, DOB 01-08-1946, MRN [id number]  PCP:  Norlene Beavers, MD (Inactive)  Cardiologist:  Lasalle Pointer, MD Electrophysiologist:  None   History of Present Illness: Natalie Holder is a 78 y.o. female known to have HTN, HLD was referred to cardiology for evaluation of pulmonary hypertension.  Echocardiogram from PCPs office is reviewed.  Normal LVEF, moderate MR, moderate TR, moderate pulmonary hypertension.  She denies having any symptoms of DOE, orthopnea, leg swelling.  No angina, dizziness, presyncope, syncope, palpitations.  Past Medical History:  Diagnosis Date   Acid reflux    Anxiety    Arthritis    Hypercholesteremia    Hypertension    Hypothyroidism     Past Surgical History:  Procedure Laterality Date   ABDOMINAL HYSTERECTOMY     partial   CATARACT EXTRACTION W/PHACO Left 03/02/2018   Procedure: CATARACT EXTRACTION PHACO AND INTRAOCULAR LENS PLACEMENT (IOC);  Surgeon: Anner Kill, MD;  Location: [facility];  Service: Ophthalmology;  Laterality: Left;  CDE: 6.23   CATARACT EXTRACTION W/PHACO Right 03/16/2018   Procedure: CATARACT EXTRACTION WITH PHACOEMULSIFICATION AND INTRAOCULAR LENS PLACEMENT RIGHT EYE;  Surgeon: Anner Kill, MD;  Location: [facility];  Service: Ophthalmology;  Laterality: Right;  CDE: 6.85    Current Outpatient Medications  Medication Sig Dispense Refill   Calcium-Phosphorus-Vitamin D (CITRACAL +D3 PO) Take 1 tablet by mouth every morning. Mid-Morning     cholecalciferol (VITAMIN D) 1000 units tablet Take 1,000 Units by mouth every morning. Mid-Morning     citalopram (CELEXA) 20 MG tablet Take 20 mg by mouth every morning. Mid-Morning     levothyroxine (SYNTHROID, LEVOTHROID) 75 MCG tablet Take 75 mcg by mouth daily before breakfast.     metoprolol tartrate (LOPRESSOR) 25 MG tablet Take 25 mg by mouth daily after supper.     omeprazole (PRILOSEC) 10 MG capsule Take 10 mg by mouth daily.      RESTASIS 0.05 % ophthalmic emulsion      simvastatin (ZOCOR) 20 MG tablet Take 20 mg by mouth at bedtime.     triamterene-hydrochlorothiazide (MAXZIDE-25) 37.5-25 MG tablet Take 1 tablet by mouth daily after breakfast.     No current facility-administered medications for this visit.   Allergies:  Doxycycline and Tetracycline   Social History: The patient  reports that she has never smoked. She has never used smokeless tobacco. She reports that she does not drink alcohol and does not use drugs.   Family History: The patient's family history is not on file.   ROS:  Please see the history of present illness. Otherwise, complete review of systems is positive for none  All other systems are reviewed and negative.   Physical Exam: VS:  BP 120/80   Pulse 72   Ht 5\' 7"  (1.702 m)   Wt 163 lb (73.9 kg)   SpO2 98%   BMI 25.53 kg/m , BMI Body mass index is 25.53 kg/m.  Wt Readings from Last 3 Encounters:  05/10/24 163 lb (73.9 kg)  02/24/18 160 lb (72.6 kg)    General: Patient appears comfortable at rest. HEENT: Conjunctiva and lids normal, oropharynx clear with moist mucosa. Neck: Supple, no elevated JVP or carotid bruits, no thyromegaly. Lungs: Clear to auscultation, nonlabored breathing at rest. Cardiac: Regular rate and rhythm, no S3 or significant systolic murmur, no pericardial rub. Abdomen: Soft, nontender, no hepatomegaly, bowel sounds present, no guarding or rebound. Extremities: No pitting edema, distal pulses  2+. Skin: Warm and dry. Musculoskeletal: No kyphosis. Neuropsychiatric: Alert and oriented x3, affect grossly appropriate.  Recent Labwork: No results found for requested labs within last 365 days.  No results found for: "CHOL", "TRIG", "HDL", "CHOLHDL", "VLDL", "LDLCALC", "LDLDIRECT"   Assessment and Plan:  Moderate MR, moderate TR, mild aortic valve stenosis, moderate pulmonary hypertension: I reviewed the echocardiogram report from the PCPs office that showed  normal LVEF, moderate MR, moderate TR, moderate pulmonary hypertension.  I do not have the images to review.  She is asymptomatic, does not have any DOE, orthopnea, leg swelling.  No angina, dizziness, presyncope.  Overall doing great.  Will repeat echocardiogram in 1 year.    HTN, controlled: Continue current antihypertensive medications, metoprolol titrate 20 mg daily with supper, triamterene-HCTZ 37.5-25 mg once daily.  She follows with PCP.  HLD, unknown value: Continue simvastatin 20 mg nightly.  Goal LDL is 100.       Medication Adjustments/Labs and Tests Ordered: Current medicines are reviewed at length with the patient today.  Concerns regarding medicines are outlined above.    Disposition:  Follow up 1 year or sooner if she develops any symptoms of DOE  Signed Ryiah Bellissimo Priya Aerik Polan, MD, 05/10/2024 3:31 PM    [facility] [address] [place], [place], [place] [address]

## 2024-10-21 ENCOUNTER — Ambulatory Visit: Admitting: Obstetrics

## 2024-10-21 ENCOUNTER — Encounter: Payer: Self-pay | Admitting: Obstetrics

## 2024-10-21 VITALS — BP 128/77 | HR 82 | Ht 65.0 in | Wt 162.2 lb

## 2024-10-21 DIAGNOSIS — K5900 Constipation, unspecified: Secondary | ICD-10-CM

## 2024-10-21 DIAGNOSIS — N3946 Mixed incontinence: Secondary | ICD-10-CM

## 2024-10-21 DIAGNOSIS — N952 Postmenopausal atrophic vaginitis: Secondary | ICD-10-CM

## 2024-10-21 DIAGNOSIS — R351 Nocturia: Secondary | ICD-10-CM

## 2024-10-21 DIAGNOSIS — N819 Female genital prolapse, unspecified: Secondary | ICD-10-CM

## 2024-10-21 DIAGNOSIS — N3941 Urge incontinence: Secondary | ICD-10-CM | POA: Insufficient documentation

## 2024-10-21 LAB — POCT URINALYSIS DIP (CLINITEK)
Bilirubin, UA: NEGATIVE
Blood, UA: NEGATIVE
Glucose, UA: NEGATIVE mg/dL
Ketones, POC UA: NEGATIVE mg/dL
Leukocytes, UA: NEGATIVE
Nitrite, UA: NEGATIVE
POC PROTEIN,UA: NEGATIVE
Spec Grav, UA: 1.01 (ref 1.010–1.025)
Urobilinogen, UA: 0.2 U/dL
pH, UA: 6.5 (ref 5.0–8.0)

## 2024-10-21 MED ORDER — TROSPIUM CHLORIDE ER 60 MG PO CP24: 1.0000 | ORAL_CAPSULE | Freq: Every day | ORAL | 2 refills | Status: DC

## 2024-10-21 MED ORDER — ESTRADIOL 0.01 % VA CREA: TOPICAL_CREAM | VAGINAL | 3 refills | Status: AC

## 2024-10-21 NOTE — Patient Instructions (Addendum)
 You have a stage 3 (out of 4) prolapse.  We discussed the fact that it is not life threatening but there are several treatment options. For treatment of pelvic organ prolapse, we discussed options for management including expectant management, conservative management, and surgical management, such as Kegels, a pessary, pelvic floor physical therapy, and specific surgical procedures.   We discussed the symptoms of overactive bladder (OAB), which include urinary urgency, urinary frequency, night-time urination, with or without urge incontinence.  We discussed management including behavioral therapy (decreasing bladder irritants by following a bladder diet, urge suppression strategies, timed voids, bladder retraining), physical therapy, medication; and for refractory cases posterior tibial nerve stimulation, sacral neuromodulation, and intravesical botulinum toxin injection.   For anticholinergic medications, we discussed the potential side effects of anticholinergics including dry eyes, dry mouth, constipation, rare risks of cognitive impairment and urinary retention. You were given prescription for Trospium 60mg  XL.  It can take a month to start working so give it time, but if you have bothersome side effects call sooner and we can try a different medication.  Call us  if you have trouble filling the prescription or if it's not covered by your insurance.  For treatment of stress urinary incontinence, which is leakage with physical activity/movement/strainging/coughing, we discussed expectant management versus nonsurgical options versus surgery. Nonsurgical options include weight loss, physical therapy, as well as a pessary.  Surgical options include a midurethral sling, which is a synthetic mesh sling that acts like a hammock under the urethra to prevent leakage of urine, a Burch urethropexy, and transurethral injection of a bulking agent.   Constipation: Our goal is to achieve formed bowel movements daily or  every-other-day.  You may need to try different combinations of the following options to find what works best for you - everybody's body works differently so feel free to adjust the dosages as needed.  Some options to help maintain bowel health include:  Dietary changes (more leafy greens, vegetables and fruits; less processed foods) Fiber supplementation (Benefiber, FiberCon, Metamucil or Psyllium). Start slow and increase gradually to full dose. Over-the-counter agents such as: stool softeners (Docusate or Colace) and/or laxatives (Miralax, milk of magnesia)  Power Pudding is a natural mixture that may help your constipation.  To make blend 1 cup applesauce, 1 cup wheat bran, and 3/4 cup prune juice, refrigerate and then take 1 tablespoon daily with a large glass of water as needed.   Women should try to eat at least 21 to 25 grams of fiber a day, while men should aim for 30 to 38 grams a day. You can add fiber to your diet with food or a fiber supplement such as psyllium (metamucil), benefiber, or fibercon.   Here's a look at how much dietary fiber is found in some common foods. When buying packaged foods, check the Nutrition Facts label for fiber content. It can vary among brands.  Fruits Serving size Total fiber (grams)*  Raspberries 1 cup 8.0  Pear 1 medium 5.5  Apple, with skin 1 medium 4.5  Banana 1 medium 3.0  Orange 1 medium 3.0  Strawberries 1 cup 3.0   Vegetables Serving size Total fiber (grams)*  Green peas, boiled 1 cup 9.0  Broccoli, boiled 1 cup chopped 5.0  Turnip greens, boiled 1 cup 5.0  Brussels sprouts, boiled 1 cup 4.0  Potato, with skin, baked 1 medium 4.0  Sweet corn, boiled 1 cup 3.5  Cauliflower, raw 1 cup chopped 2.0  Carrot, raw 1 medium 1.5  Grains Serving size Total fiber (grams)*  Spaghetti, whole-wheat, cooked 1 cup 6.0  Barley, pearled, cooked 1 cup 6.0  Bran flakes 3/4 cup 5.5  Quinoa, cooked 1 cup 5.0  Oat bran muffin 1 medium 5.0  Oatmeal,  instant, cooked 1 cup 5.0  Popcorn, air-popped 3 cups 3.5  Brown rice, cooked 1 cup 3.5  Bread, whole-wheat 1 slice 2.0  Bread, rye 1 slice 2.0   Legumes, nuts and seeds Serving size Total fiber (grams)*  Split peas, boiled 1 cup 16.0  Lentils, boiled 1 cup 15.5  Black beans, boiled 1 cup 15.0  Baked beans, canned 1 cup 10.0  Chia seeds 1 ounce 10.0  Almonds 1 ounce (23 nuts) 3.5  Pistachios 1 ounce (49 nuts) 3.0  Sunflower kernels 1 ounce 3.0  *Rounded to nearest 0.5 gram. Source: [employer] for [employer], [employer] Release      For vaginal atrophy (thinning of the vaginal tissue that can cause dryness and burning) and UTI prevention we discussed estrogen replacement in the form of vaginal cream.   Start vaginal estrogen therapy nightly for two weeks then 2 times weekly at night. This can be placed with your finger or an applicator inside the vagina and around the urethra.  Please let us  know if the prescription is too expensive and we can look for alternative options.   Is vaginal estrogen therapy safe for me? Vaginal estrogen preparations act on the vaginal skin, and only a very tiny amount is absorbed into the bloodstream (0.01%).  They work in a similar way to hand or face cream.  There is minimal absorption and they are therefore perfectly safe. If you have had breast cancer and have persistent troublesome symptoms which aren't settling with vaginal moisturisers and lubricants, local estrogen treatment may be a possibility, but consultation with your oncologist should take place first.   - discussed proper vulvar care, warm compression, avoid pad use, cotton only underwear and barrier ointment if needed  - encouraged Vit E suppository, moisturizer with Replens/Revaree

## 2024-10-21 NOTE — Assessment & Plan Note (Addendum)
-   symptoms for 7-8 years - For treatment of pelvic organ prolapse, we discussed options for management including expectant management, conservative management, and surgical management, such as Kegels, a pessary, pelvic floor physical therapy, and specific surgical procedures. - encouraged to optimize stool consistency and reviewed Kegel exercises - start low dose vaginal estrogen - discussed proper vulvar care, warm compression, avoid pad use, cotton only underwear and barrier ointment if needed  - encouraged Vit E suppository, moisturizer with Replens/Revaree - pt desires expectant management at this time

## 2024-10-21 NOTE — Progress Notes (Signed)
 New Patient Evaluation and Consultation  Referring Provider: Leonel Cole, MD PCP: Signa Dire, MD (Inactive) Date of Service: 10/21/2024  SUBJECTIVE Chief Complaint: New Patient (Initial Visit) Natalie Holder is a 78 y.o. female here today for female organ prolapse.)  History of Present Illness: Natalie Holder is a 78 y.o. White or Caucasian female seen in consultation at the request of Dr Leonel for evaluation of pelvic organ prolapse and urinary incontinence.    Golf ball size vaginal bulge for around 7-8 years noted during a shower, sometimes resolves spontaneously  Reviewed with Dr. Signa and recommended to monitor until symptoms change. Reduces intermittent for comfort.  Denies bleeding, itching, or pain. Reports yellow discharge.  Urinary symptoms started around 1 year ago and started using 1 everdries absorbant underwear/day. One episode soaking onto clothing Reports bedwetting in childhood with history of constipation, reports grandmother uses bobbypin to evacuate bowel. Uses miralax every 3 days Reports increase symptoms with RSV infection Concerns due to friend's physician discussing possible bad outcomes with untreated prolapse.  Review of records significant for: Pulmonary HTN, Mitral regurgitation, aortic stenosis managed by Dr. Stacia, Pre-DM with HBA1C 5.8 in 01/22/24, Stage III CKD, spinal stenosis with lock back pain HSV, h/o shingles, memory changes  Urinary Symptoms: Leaks urine with lifting, going from sitting to standing, with movement to the bathroom, and with urgency Leaks 1-3 time(s) per days with urgency, more bothersome Leakage 1-2/day with lifting, denies leakage with coughing/sneezing Denies pad use, managed with everdires underwear  Patient is bothered by UI symptoms.  Day time voids 5-6.  Nocturia: 1-3 times per night to void since adulthood due to sensation of bladder fullness Drinking fluids around bedtime with water bedside the bed due to dry  mouth Denies snoring or sleep apnea, denies leg swelling. Voiding dysfunction:  empties bladder well.  Patient does not use a catheter to empty bladder.  When urinating, patient feels a weak stream, difficulty starting urine stream, and to push on her belly or vagina to empty bladder Drinks: 32-48oz water per day, 16oz coffee  UTIs: 0 UTI's in the last year.   Denies history of blood in urine, kidney or bladder stones, pyelonephritis, bladder cancer, and kidney cancer No results found for the last 90 days.   Pelvic Organ Prolapse Symptoms:                  Patient Admits to a feeling of a bulge the vaginal area. It has been present for 1 years.  Patient Admits to seeing a bulge.  This bulge is bothersome.  Bowel Symptom: Bowel movements: 2-3 time(s) per week since as long as she can remember Stool consistency: mostly Type V stool, intermittent Type I stool hard 2x/month Straining: no.  Splinting: no.  Incomplete evacuation: no.  Patient Denies accidental bowel leakage / fecal incontinence Bowel regimen: none, miralax PRN Last colonoscopy: Date 2010, due 2020, Results diverticula per patient, unavailable for review [facility] Colonoscopy   This patient has no relevant Health Maintenance data.     Sexual Function Sexually active: no.  Sexual orientation: Straight Pain with sex: has discomfort due to dryness in the past  Pelvic Pain Denies pelvic pain   Past Medical History:  Past Medical History:  Diagnosis Date   Acid reflux    Anxiety    Arthritis    Hypercholesteremia    Hypertension    Hypothyroidism      Past Surgical History:   Past Surgical History:  Procedure Laterality Date  CATARACT EXTRACTION W/PHACO Left 03/02/2018   Procedure: CATARACT EXTRACTION PHACO AND INTRAOCULAR LENS PLACEMENT (IOC);  Surgeon: Perley Hamilton, MD;  Location: [facility];  Service: Ophthalmology;  Laterality: Left;  CDE: 6.23   CATARACT EXTRACTION W/PHACO Right 03/16/2018   Procedure:  CATARACT EXTRACTION WITH PHACOEMULSIFICATION AND INTRAOCULAR LENS PLACEMENT RIGHT EYE;  Surgeon: Perley Hamilton, MD;  Location: [facility];  Service: Ophthalmology;  Laterality: Right;  CDE: 6.85   VAGINAL HYSTERECTOMY       Past OB/GYN History: OB History  Gravida Para Term Preterm AB Living  2 2 2   2   SAB IAB Ectopic Multiple Live Births      2    # Outcome Date GA Lbr Len/2nd Weight Sex Type Anes PTL Lv  2 Term     M Vag-Spont   LIV  1 Term     M Vag-Spont   LIV    Vaginal deliveries: largest infant 8lbs,  Forceps/ Vacuum deliveries: 0, Cesarean section: 0 Menopausal: Yes, at age 29-45, Denies vaginal bleeding since menopause Contraception: s/p menopause and vaginal hysterectomy for birth control by Dr. Elsa around 2012 while undergoing divorce. Last pap smear was unknown.  Any history of abnormal pap smears: no. No results found for: DIAGPAP, HPVHIGH, ADEQPAP  Medications: Patient has a current medication list which includes the following prescription(s): multiple vitamins-minerals, cholecalciferol, citalopram, estradiol, levothyroxine, metoprolol tartrate, omeprazole, restasis, simvastatin, triamterene-hydrochlorothiazide, trospium chloride, and turmeric.   Allergies: Patient is allergic to other, doxycycline, and tetracycline.   Social History:  Social History   Tobacco Use   Smoking status: Never   Smokeless tobacco: Never  Vaping Use   Vaping status: Never Used  Substance Use Topics   Alcohol use: No   Drug use: No    Relationship status: single Patient lives with her roommate.   Patient is not employed. Regular exercise: No History of abuse: No  Family History:   Family History  Problem Relation Age of Onset   Breast cancer Neg Hx    Bladder Cancer Neg Hx    Renal cancer Neg Hx    Uterine cancer Neg Hx      Review of Systems: Review of Systems  Constitutional:  Negative for fever, malaise/fatigue and weight loss.  Respiratory:  Negative for cough,  shortness of breath and wheezing.   Cardiovascular:  Negative for chest pain, palpitations and leg swelling.  Gastrointestinal:  Positive for constipation. Negative for abdominal pain and blood in stool.  Genitourinary:  Positive for frequency (night time). Negative for dysuria, hematuria and urgency.       Leakage, vaginal discharge, bulge  Skin:  Negative for rash.  Neurological:  Positive for dizziness. Negative for weakness and headaches.  Endo/Heme/Allergies:  Does not bruise/bleed easily.  Psychiatric/Behavioral:  Negative for depression. The patient is not nervous/anxious.      OBJECTIVE Physical Exam: Vitals:   10/21/24 1253  BP: 128/77  Pulse: 82  Weight: 162 lb 3.2 oz (73.6 kg)  Height: 5' 5 (1.651 m)    Physical Exam Constitutional:      General: She is not in acute distress.    Appearance: Normal appearance.  Genitourinary:     Bladder and urethral meatus normal.     No lesions in the vagina.     Right Labia: No rash, tenderness, lesions, skin changes or Bartholin's cyst.    Left Labia: No tenderness, lesions, skin changes, Bartholin's cyst or rash.    No vaginal discharge, erythema, tenderness, bleeding, ulceration or  granulation tissue.     Anterior, posterior and apical vaginal prolapse present.    Severe vaginal atrophy present.     Right Adnexa: not tender, not full and no mass present.    Left Adnexa: not tender, not full and no mass present.    Cervix is absent.     Uterus is absent.     Urethral meatus caruncle present.    No urethral prolapse, tenderness, mass, hypermobility, discharge or stress urinary incontinence with cough stress test present.     Bladder is not tender, urgency on palpation not present and masses not present.      Pelvic Floor: Levator muscle strength is 4/5.    Levator ani not tender, obturator internus not tender, no asymmetrical contractions present and no pelvic spasms present.    Symmetrical pelvic sensation, anal wink  present and BC reflex present. Cardiovascular:     Rate and Rhythm: Normal rate.  Pulmonary:     Effort: Pulmonary effort is normal. No respiratory distress.  Abdominal:     General: There is no distension.     Palpations: Abdomen is soft. There is no mass.     Tenderness: There is no abdominal tenderness.     Hernia: No hernia is present.  Neurological:     Mental Status: She is alert.  Vitals reviewed. Exam conducted with a chaperone present.      POP-Q:   POP-Q  3                                            Aa   4                                           Ba  4                                              C   4                                            Gh  2                                            Pb  6                                            tvl   3                                            Ap  4  Bp                                                 D    Straight Catheterization Procedure for PVR: After verbal consent was obtained from the patient for catheterization to assess bladder emptying and residual volume the urethra and surrounding tissues were prepped with betadine  and an in and out catheterization was performed.  PVR was 50mL.  Urine appeared clear yellow. The patient tolerated the procedure well.   Laboratory Results: Lab Results  Component Value Date   COLORU yellow 10/21/2024   CLARITYU clear 10/21/2024   GLUCOSEUR negative 10/21/2024   BILIRUBINUR negative 10/21/2024   SPECGRAV 1.010 10/21/2024   RBCUR negative 10/21/2024   PHUR 6.5 10/21/2024   UROBILINOGEN 0.2 10/21/2024   LEUKOCYTESUR Negative 10/21/2024    Lab Results  Component Value Date   CREATININE 0.86 02/24/2018    No results found for: HGBA1C  Lab Results  Component Value Date   HGB 12.3 02/24/2018     ASSESSMENT AND PLAN Ms. Nobrega is a 78 y.o. with:  1. Vaginal vault prolapse   2. Urinary incontinence, mixed    3. Constipation, unspecified constipation type   4. Vaginal atrophy   5. Nocturia     Vaginal vault prolapse Assessment & Plan: - symptoms for 7-8 years - For treatment of pelvic organ prolapse, we discussed options for management including expectant management, conservative management, and surgical management, such as Kegels, a pessary, pelvic floor physical therapy, and specific surgical procedures. - encouraged to optimize stool consistency and reviewed Kegel exercises - start low dose vaginal estrogen - discussed proper vulvar care, warm compression, avoid pad use, cotton only underwear and barrier ointment if needed  - encouraged Vit E suppository, moisturizer with Replens/Revaree - pt desires expectant management at this time  Orders: -     Estradiol; Place 0.5g nightly for two weeks then twice a week after  Dispense: 30 g; Refill: 3  Urinary incontinence, mixed Assessment & Plan: - POCT UA negative, PVR 50mL - urgency > stress, managed by absorbant underwear 1/day We discussed the symptoms of overactive bladder (OAB), which include urinary urgency, urinary frequency, nocturia, with or without urge incontinence.  While we do not know the exact etiology of OAB, several treatment options exist. We discussed management including behavioral therapy (decreasing bladder irritants, urge suppression strategies, timed voids, bladder retraining), physical therapy, medication; for refractory cases posterior tibial nerve stimulation, sacral neuromodulation, and intravesical botulinum toxin injection.  For anticholinergic medications, we discussed the potential side effects of anticholinergics including dry eyes, dry mouth, constipation, cognitive impairment and urinary retention. For Beta-3 agonist medication, we discussed the potential side effect of elevated blood pressure which is more likely to occur in individuals with uncontrolled hypertension. - Beta 3 agonists not covered by insurance,  Rx Trospium however reviewed risk of worsening baseline dry mouth. Encouraged pt to start trial if refractory symptoms after treatment of constipation and Kegel exercises - For treatment of stress urinary incontinence,  non-surgical options include expectant management, weight loss, physical therapy, as well as a pessary.  Surgical options include a midurethral sling, Burch urethropexy, and transurethral injection of a bulking agent. - start low dose vaginal estrogen  Orders: -     POCT URINALYSIS DIP (CLINITEK) -     Trospium Chloride ER; Take 1 capsule (  60 mg total) by mouth daily.  Dispense: 30 capsule; Refill: 2 -     Estradiol; Place 0.5g nightly for two weeks then twice a week after  Dispense: 30 g; Refill: 3  Constipation, unspecified constipation type Assessment & Plan: - uses miralax every 3 days due to prior daily use with loose stool - For constipation, we reviewed the importance of a better bowel regimen.  We also discussed the importance of avoiding chronic straining, as it can exacerbate her pelvic floor symptoms; we discussed treating constipation and straining prior to surgery, as postoperative straining can lead to damage to the repair and recurrence of symptoms. We discussed initiating therapy with increasing fluid intake, fiber supplementation, stool softeners, and laxatives such as miralax.  - encouraged slow titration of miralax and squatting position for defecation - stop trospium if it worsens bowel consistency   Vaginal atrophy Assessment & Plan: - thin yellow discharge, minimal on exam - For symptomatic vaginal atrophy options include lubrication with a water-based lubricant, personal hygiene measures and barrier protection against wetness, and estrogen replacement in the form of vaginal cream, vaginal tablets, or a time-released vaginal ring.   - Rx to start low dose vaginal estrogen  Orders: -     Estradiol; Place 0.5g nightly for two weeks then twice a week after   Dispense: 30 g; Refill: 3  Nocturia Assessment & Plan: - avoid fluid intake 3 hours before bedtime, difficulty due to dry mouth - switch diuretic (e.g. maxzide) dosing to 2pm - consider bedtime dosing of Trospium    Time spent: I spent 71 minutes dedicated to the care of this patient on the date of this encounter to include pre-visit review of records, face-to-face time with the patient discussing stage III pelvic organ prolapse, mixed urinary incontinence, constipation, vaginal atrophy, nocturia, and post visit documentation and ordering medication/ testing.   Lianne ONEIDA Gillis, MD

## 2024-10-21 NOTE — Assessment & Plan Note (Signed)
-   POCT UA negative, PVR 50mL - urgency > stress, managed by absorbant underwear 1/day We discussed the symptoms of overactive bladder (OAB), which include urinary urgency, urinary frequency, nocturia, with or without urge incontinence.  While we do not know the exact etiology of OAB, several treatment options exist. We discussed management including behavioral therapy (decreasing bladder irritants, urge suppression strategies, timed voids, bladder retraining), physical therapy, medication; for refractory cases posterior tibial nerve stimulation, sacral neuromodulation, and intravesical botulinum toxin injection.  For anticholinergic medications, we discussed the potential side effects of anticholinergics including dry eyes, dry mouth, constipation, cognitive impairment and urinary retention. For Beta-3 agonist medication, we discussed the potential side effect of elevated blood pressure which is more likely to occur in individuals with uncontrolled hypertension. - Beta 3 agonists not covered by insurance, Rx Trospium however reviewed risk of worsening baseline dry mouth. Encouraged pt to start trial if refractory symptoms after treatment of constipation and Kegel exercises - For treatment of stress urinary incontinence,  non-surgical options include expectant management, weight loss, physical therapy, as well as a pessary.  Surgical options include a midurethral sling, Burch urethropexy, and transurethral injection of a bulking agent. - start low dose vaginal estrogen

## 2024-10-21 NOTE — Assessment & Plan Note (Signed)
-   thin yellow discharge, minimal on exam - For symptomatic vaginal atrophy options include lubrication with a water-based lubricant, personal hygiene measures and barrier protection against wetness, and estrogen replacement in the form of vaginal cream, vaginal tablets, or a time-released vaginal ring.   - Rx to start low dose vaginal estrogen

## 2024-10-21 NOTE — Assessment & Plan Note (Signed)
-   avoid fluid intake 3 hours before bedtime, difficulty due to dry mouth - switch diuretic (e.g. maxzide) dosing to 2pm - consider bedtime dosing of Trospium

## 2024-10-21 NOTE — Assessment & Plan Note (Addendum)
-   uses miralax every 3 days due to prior daily use with loose stool - For constipation, we reviewed the importance of a better bowel regimen.  We also discussed the importance of avoiding chronic straining, as it can exacerbate her pelvic floor symptoms; we discussed treating constipation and straining prior to surgery, as postoperative straining can lead to damage to the repair and recurrence of symptoms. We discussed initiating therapy with increasing fluid intake, fiber supplementation, stool softeners, and laxatives such as miralax.  - encouraged slow titration of miralax and squatting position for defecation - stop trospium if it worsens bowel consistency

## 2024-11-22 ENCOUNTER — Telehealth: Payer: Self-pay

## 2024-11-22 NOTE — Telephone Encounter (Signed)
 Patient does no like the the Trospium  is working for her. She states it makes her bladder feel full and cannot empty. This has only been intermittent, She stopped this and symptoms have improved. Any recommendation for a new Rx to try?

## 2024-11-26 ENCOUNTER — Other Ambulatory Visit: Payer: Self-pay

## 2024-11-26 MED ORDER — GEMTESA 75 MG PO TABS: 75.0000 mg | ORAL_TABLET | Freq: Every day | ORAL | 2 refills | Status: AC

## 2024-11-26 NOTE — Telephone Encounter (Signed)
 Pateint aware wo will try and see if the Gemtesa  will be covered. Explained the process of the PA team. Instructed to call the office if she has no return communications from us .

## 2025-01-20 NOTE — Progress Notes (Signed)
 [facility] Return Visit  SUBJECTIVE  History of Present Illness: Natalie Holder is a 79 y.o. female seen in follow-up for stage III pelvic organ prolapse, mixed urinary incontinence, constipation, vaginal atrophy, and nocturia. Plan at last visit was trospium  at bedtime, diuretic to 2pm, low dose vaginal estrogen, titration of miralax.   Trospium  worsened urinary symptoms with sensation of difficulty voiding after a few days. Symptoms resolved after discontinuation of medication. Did not start Gemtesa  due to fear of similar side effects Started miralax every 2 days Bowel movements: 1x/day up from 2-3x/week. Mostly Type V stool, intermittent Type I stool hard 2x/month  Using vaginal estrogen 1g 2x/week Takes triamterene-HCTZ after lunch Day time voids 5-6.  Nocturia: 1-3x/night to void since adulthood due to sensation of bladder fullness reduced to 1x/night UUI 1-3x/days with urgency, more bothersome Baseline leakage 1-2/day with lifting, now 1-2x/week small volume leakage with bending over, denies leakage with coughing/sneezing.   Past Medical History: Patient  has a past medical history of Acid reflux, Anxiety, Arthritis, Hypercholesteremia, Hypertension, and Hypothyroidism.   Past Surgical History: She  has a past surgical history that includes Vaginal hysterectomy; Cataract extraction w/PHACO (Left, 03/02/2018); and Cataract extraction w/PHACO (Right, 03/16/2018).   Medications: She has a current medication list which includes the following prescription(s): multiple vitamins-minerals, cholecalciferol, citalopram, estradiol , levothyroxine, metoprolol tartrate, restasis, simvastatin, triamterene-hydrochlorothiazide, turmeric, gemtesa , and gemtesa .   Allergies: Patient is allergic to enalapril, nsaids, other, doxycycline, and tetracycline.   Social History: Patient  reports that she has never smoked. She has never used smokeless tobacco. She reports that she does not drink  alcohol and does not use drugs.     OBJECTIVE     Physical Exam: Vitals:   01/21/25 1410  BP: 132/72  Pulse: 83   Gen: No apparent distress, A&O x 3. Pelvic Exam: Normal external female genitalia; Bartholin's and Skene's glands normal in appearance; urethral meatus with caruncle, no urethral masses or discharge.   A size 4 incontinence dish pessary was fitted. It was comfortable, stayed in place with valsalva and was an appropriate size on examination, with one finger fitting between the pessary and the vaginal walls. Some descent with valsalva and removed without difficulty.  A size 5 with with support pessary was fitted. It was comfortable, stayed in place with valsalva and was an appropriate size on examination, with one finger fitting between the pessary and the vaginal walls. Patient was able to remove and replace without difficulty LOT: [id number]      ASSESSMENT AND PLAN    Natalie Holder is a 79 y.o. with:  1. Urinary incontinence, mixed   2. Vaginal vault prolapse   3. Constipation, unspecified constipation type   4. Vaginal atrophy   5. Nocturia   6. Urgency incontinence   7. Encounter for fitting and adjustment of pessary     Urinary incontinence, mixed Assessment & Plan: - 10/21/24 POCT UA negative, PVR 50mL - urgency > stress, managed by absorbant underwear 1/day We discussed the symptoms of overactive bladder (OAB), which include urinary urgency, urinary frequency, nocturia, with or without urge incontinence.  While we do not know the exact etiology of OAB, several treatment options exist. We discussed management including behavioral therapy (decreasing bladder irritants, urge suppression strategies, timed voids, bladder retraining), physical therapy, medication; for refractory cases posterior tibial nerve stimulation, sacral neuromodulation, and intravesical botulinum toxin injection.  For anticholinergic medications, we discussed the potential side effects of  anticholinergics including dry eyes, dry mouth, constipation,  cognitive impairment and urinary retention. For Beta-3 agonist medication, we discussed the potential side effect of elevated blood pressure which is more likely to occur in individuals with uncontrolled hypertension. - Beta 3 agonists not covered by insurance, Rx Trospium  however reviewed risk of worsening baseline dry mouth. Encouraged pt to start trial if refractory symptoms after treatment of constipation and Kegel exercises - For treatment of stress urinary incontinence,  non-surgical options include expectant management, weight loss, physical therapy, as well as a pessary.  Surgical options include a midurethral sling, Burch urethropexy, and transurethral injection of a bulking agent. - continue low dose vaginal estrogen - stopped trospium  due to increased sensation of difficulty emptying - encouraged to start trial of Gemtesa   Orders: -     Gemtesa ; Take 1 tablet (75 mg total) by mouth daily.  Dispense: 30 tablet; Refill: 2  Vaginal vault prolapse Assessment & Plan: - symptoms for 7-8 years - For treatment of pelvic organ prolapse, we discussed options for management including expectant management, conservative management, and surgical management, such as Kegels, a pessary, pelvic floor physical therapy, and specific surgical procedures. - encouraged to continue to optimize stool consistency and reviewed Kegel exercises - continue low dose vaginal estrogen - discussed proper vulvar care, warm compression, avoid pad use, cotton only underwear and barrier ointment if needed  We discussed two options for prolapse repair:  1) vaginal repair without mesh - Pros - safer, no mesh complications - Cons - not as strong as mesh repair, higher risk of recurrence  2) laparoscopic repair with mesh - Pros - stronger, better long-term success - Cons - risks of mesh implant (erosion into vagina or bladder, adhering to the rectum, pain) -  these risks are lower than with a vaginal mesh but still exist  3) Vaginal closure procedure - pros - strong, good long-term success - cons - unable to have penetrative intercourse - denies sexual activity and considering obliterative procedure - encouraged Vit E suppository, moisturizer with Replens/Revaree - trial of pessary due to sensation of incomplete emptying. Fitted for size 5 ring with support pessary, patient encouraged to start self management - discussed risk of occult SUI - reviewed office procedure with urethral bulking (Bulkamid). We discussed success rate of approximately 70-80% and possible need for second injection. We reviewed that this is not a permanent procedure and the Bulkamid does become less effective over time. Risks reviewed including injury to bladder or urethra, UTI, urinary retention and hematuria.  - Sling: The effectiveness of a midurethral vaginal mesh sling is approximately 85%, and thus, there will be times when you may leak urine after surgery, especially if your bladder is full or if you have a strong cough. There is a balance between making the sling tight enough to treat your leakage but not too tight so that you have long-term difficulty emptying your bladder. A mesh sling will not directly treat overactive bladder/urge incontinence and may worsen it.  There is an FDA safety notification on vaginal mesh procedures for prolapse but NOT mesh slings. We have extensive experience and training with mesh placement and we have close postoperative follow up to identify any potential complications from mesh. It is important to realize that this mesh is a permanent implant that cannot be easily removed. There are rare risks of mesh exposure (2-4%), pain with intercourse (0-7%), and infection (<1%). The risk of mesh exposure if more likely in a woman with risks for poor healing (prior radiation, poorly controlled diabetes, or immunocompromised).  The risk of new or worsened  chronic pain after mesh implant is more common in women with baseline chronic pain and/or poorly controlled anxiety or depression. Approximately 2-4% of patients will experience longer-term post-operative voiding dysfunction that may require surgical revision of the sling. We also reviewed that postoperatively, her stream may not be as strong as before surgery.  - considering midurethral sling if SUI noted - repeat CST and will need Simple CMG prior to surgical intervention   Constipation, unspecified constipation type Assessment & Plan: - increased miralax every 3 days increased to 2 days (prior daily use with loose stool) - For constipation, we reviewed the importance of a better bowel regimen.  We also discussed the importance of avoiding chronic straining, as it can exacerbate her pelvic floor symptoms; we discussed treating constipation and straining prior to surgery, as postoperative straining can lead to damage to the repair and recurrence of symptoms. We discussed initiating therapy with increasing fluid intake, fiber supplementation, stool softeners, and laxatives such as miralax.  - encouraged to continue titration of miralax and squatting position for defecation to avoid straining due to risk of pessary expulsion - stopped trospium    Vaginal atrophy Assessment & Plan: - thin yellow discharge, minimal on exam - For symptomatic vaginal atrophy options include lubrication with a water-based lubricant, personal hygiene measures and barrier protection against wetness, and estrogen replacement in the form of vaginal cream, vaginal tablets, or a time-released vaginal ring.   - continue low dose vaginal estrogen   Nocturia Assessment & Plan: - reduced to 1x/night - avoid fluid intake 3 hours before bedtime, difficulty due to dry mouth - continue diuretic (e.g. maxzide) after lunchtime - trial of Gemtesa  at bedtime    Urgency incontinence Assessment & Plan: - 10/21/24 POCT UA  negative, PVR 50mL - urgency > stress, managed by absorbant underwear 1/day We discussed the symptoms of overactive bladder (OAB), which include urinary urgency, urinary frequency, nocturia, with or without urge incontinence.  While we do not know the exact etiology of OAB, several treatment options exist. We discussed management including behavioral therapy (decreasing bladder irritants, urge suppression strategies, timed voids, bladder retraining), physical therapy, medication; for refractory cases posterior tibial nerve stimulation, sacral neuromodulation, and intravesical botulinum toxin injection.  For anticholinergic medications, we discussed the potential side effects of anticholinergics including dry eyes, dry mouth, constipation, cognitive impairment and urinary retention. For Beta-3 agonist medication, we discussed the potential side effect of elevated blood pressure which is more likely to occur in individuals with uncontrolled hypertension. - Beta 3 agonists not covered by insurance, Rx Trospium  however reviewed risk of worsening baseline dry mouth. Encouraged pt to start trial if refractory symptoms after treatment of constipation and Kegel exercises - For treatment of stress urinary incontinence,  non-surgical options include expectant management, weight loss, physical therapy, as well as a pessary.  Surgical options include a midurethral sling, Burch urethropexy, and transurethral injection of a bulking agent. - continue low dose vaginal estrogen - stopped trospium  due to increased sensation of difficulty emptying - encouraged to start trial of Gemtesa    Encounter for fitting and adjustment of pessary Assessment & Plan: - discussed risk of change in urinary or bowel symptoms, vaginal ulceration, discharge, bleeding, fistula formation. Explained that pt may require multiple sizes and types for fitting.  - failed size 4 incontinence dish pessary, trial of size 5 with with support pessary  and encouraged self management   Time spent: I spent 52 minutes dedicated to the  care of this patient on the date of this encounter to include pre-visit review of records, face-to-face time with the patient discussing stage III pelvic organ prolapse, mixed urinary incontinence, constipation, vaginal atrophy, and nocturia and post visit documentation and ordering medication/ testing.    Lianne ONEIDA Gillis, MD

## 2025-01-21 ENCOUNTER — Encounter: Payer: Self-pay | Admitting: Obstetrics

## 2025-01-21 ENCOUNTER — Ambulatory Visit: Admitting: Obstetrics

## 2025-01-21 VITALS — BP 132/72 | HR 83

## 2025-01-21 DIAGNOSIS — N952 Postmenopausal atrophic vaginitis: Secondary | ICD-10-CM

## 2025-01-21 DIAGNOSIS — K5900 Constipation, unspecified: Secondary | ICD-10-CM

## 2025-01-21 DIAGNOSIS — R351 Nocturia: Secondary | ICD-10-CM | POA: Diagnosis not present

## 2025-01-21 DIAGNOSIS — Z4689 Encounter for fitting and adjustment of other specified devices: Secondary | ICD-10-CM | POA: Insufficient documentation

## 2025-01-21 DIAGNOSIS — N3941 Urge incontinence: Secondary | ICD-10-CM

## 2025-01-21 DIAGNOSIS — N819 Female genital prolapse, unspecified: Secondary | ICD-10-CM

## 2025-01-21 DIAGNOSIS — N3946 Mixed incontinence: Secondary | ICD-10-CM

## 2025-01-21 MED ORDER — GEMTESA 75 MG PO TABS: 75.0000 mg | ORAL_TABLET | Freq: Every day | ORAL | 2 refills | Status: AC

## 2025-01-21 NOTE — Assessment & Plan Note (Signed)
-   thin yellow discharge, minimal on exam - For symptomatic vaginal atrophy options include lubrication with a water-based lubricant, personal hygiene measures and barrier protection against wetness, and estrogen replacement in the form of vaginal cream, vaginal tablets, or a time-released vaginal ring.   - continue low dose vaginal estrogen

## 2025-01-21 NOTE — Assessment & Plan Note (Signed)
-   discussed risk of change in urinary or bowel symptoms, vaginal ulceration, discharge, bleeding, fistula formation. Explained that pt may require multiple sizes and types for fitting.  - failed size 4 incontinence dish pessary, trial of size 5 with with support pessary and encouraged self management

## 2025-01-21 NOTE — Assessment & Plan Note (Signed)
-   symptoms for 7-8 years - For treatment of pelvic organ prolapse, we discussed options for management including expectant management, conservative management, and surgical management, such as Kegels, a pessary, pelvic floor physical therapy, and specific surgical procedures. - encouraged to continue to optimize stool consistency and reviewed Kegel exercises - continue low dose vaginal estrogen - discussed proper vulvar care, warm compression, avoid pad use, cotton only underwear and barrier ointment if needed  We discussed two options for prolapse repair:  1) vaginal repair without mesh - Pros - safer, no mesh complications - Cons - not as strong as mesh repair, higher risk of recurrence  2) laparoscopic repair with mesh - Pros - stronger, better long-term success - Cons - risks of mesh implant (erosion into vagina or bladder, adhering to the rectum, pain) - these risks are lower than with a vaginal mesh but still exist  3) Vaginal closure procedure - pros - strong, good long-term success - cons - unable to have penetrative intercourse - denies sexual activity and considering obliterative procedure - encouraged Vit E suppository, moisturizer with Replens/Revaree - trial of pessary due to sensation of incomplete emptying. Fitted for size 5 ring with support pessary, patient encouraged to start self management - discussed risk of occult SUI - reviewed office procedure with urethral bulking (Bulkamid). We discussed success rate of approximately 70-80% and possible need for second injection. We reviewed that this is not a permanent procedure and the Bulkamid does become less effective over time. Risks reviewed including injury to bladder or urethra, UTI, urinary retention and hematuria.  - Sling: The effectiveness of a midurethral vaginal mesh sling is approximately 85%, and thus, there will be times when you may leak urine after surgery, especially if your bladder is full or if you have a strong  cough. There is a balance between making the sling tight enough to treat your leakage but not too tight so that you have long-term difficulty emptying your bladder. A mesh sling will not directly treat overactive bladder/urge incontinence and may worsen it.  There is an FDA safety notification on vaginal mesh procedures for prolapse but NOT mesh slings. We have extensive experience and training with mesh placement and we have close postoperative follow up to identify any potential complications from mesh. It is important to realize that this mesh is a permanent implant that cannot be easily removed. There are rare risks of mesh exposure (2-4%), pain with intercourse (0-7%), and infection (<1%). The risk of mesh exposure if more likely in a woman with risks for poor healing (prior radiation, poorly controlled diabetes, or immunocompromised). The risk of new or worsened chronic pain after mesh implant is more common in women with baseline chronic pain and/or poorly controlled anxiety or depression. Approximately 2-4% of patients will experience longer-term post-operative voiding dysfunction that may require surgical revision of the sling. We also reviewed that postoperatively, her stream may not be as strong as before surgery.  - considering midurethral sling if SUI noted - repeat CST and will need Simple CMG prior to surgical intervention

## 2025-01-21 NOTE — Assessment & Plan Note (Signed)
-   10/21/24 POCT UA negative, PVR 50mL - urgency > stress, managed by absorbant underwear 1/day We discussed the symptoms of overactive bladder (OAB), which include urinary urgency, urinary frequency, nocturia, with or without urge incontinence.  While we do not know the exact etiology of OAB, several treatment options exist. We discussed management including behavioral therapy (decreasing bladder irritants, urge suppression strategies, timed voids, bladder retraining), physical therapy, medication; for refractory cases posterior tibial nerve stimulation, sacral neuromodulation, and intravesical botulinum toxin injection.  For anticholinergic medications, we discussed the potential side effects of anticholinergics including dry eyes, dry mouth, constipation, cognitive impairment and urinary retention. For Beta-3 agonist medication, we discussed the potential side effect of elevated blood pressure which is more likely to occur in individuals with uncontrolled hypertension. - Beta 3 agonists not covered by insurance, Rx Trospium  however reviewed risk of worsening baseline dry mouth. Encouraged pt to start trial if refractory symptoms after treatment of constipation and Kegel exercises - For treatment of stress urinary incontinence,  non-surgical options include expectant management, weight loss, physical therapy, as well as a pessary.  Surgical options include a midurethral sling, Burch urethropexy, and transurethral injection of a bulking agent. - continue low dose vaginal estrogen - stopped trospium  due to increased sensation of difficulty emptying - encouraged to start trial of Gemtesa

## 2025-01-21 NOTE — Patient Instructions (Addendum)
 You are opting to try a size 5 ring with support pessary. This will keep the bulge inside and prevent it from getting worse. You can learn how to remove it yourself or we can do that for you every 3 months.   - discussed risk of change in urinary or bowel symptoms, vaginal ulceration, discharge, bleeding, fistula formation. You may require multiple sizes and types for fitting.   For your vaginal bulge we reviewed vaginal closure procedure - pros - strong, good long-term success - cons - unable to have penetrative intercourse  If you experience increased urinary incontinence.  For treatment of stress urinary incontinence, which is leakage with physical activity/movement/strainging/coughing, we discussed expectant management versus nonsurgical options versus surgery. Nonsurgical options include weight loss, physical therapy, as well as a pessary.  Surgical options include a midurethral sling, which is a synthetic mesh sling that acts like a hammock under the urethra to prevent leakage of urine, a Burch urethropexy, and transurethral injection of a bulking agent.   1) we discussed office procedure with urethral bulking (Bulkamid). We discussed success rate of approximately 70-80% and possible need for second injection. We reviewed that this is not a permanent procedure and the Bulkamid does become less effective over time. Risks reviewed including injury to bladder or urethra, UTI, urinary retention and hematuria.   2) Sling: The effectiveness of a midurethral vaginal mesh sling is approximately 85%, and thus, there will be times when you may leak urine after surgery, especially if your bladder is full or if you have a strong cough. There is a balance between making the sling tight enough to treat your leakage but not too tight so that you have long-term difficulty emptying your bladder. A mesh sling will not directly treat overactive bladder/urge incontinence and may worsen it.  There is an FDA safety  notification on vaginal mesh procedures for prolapse but NOT mesh slings. We have extensive experience and training with mesh placement and we have close postoperative follow up to identify any potential complications from mesh. It is important to realize that this mesh is a permanent implant that cannot be easily removed. There are rare risks of mesh exposure (2-4%), pain with intercourse (0-7%), and infection (<1%). The risk of mesh exposure if more likely in a woman with risks for poor healing (prior radiation, poorly controlled diabetes, or immunocompromised). The risk of new or worsened chronic pain after mesh implant is more common in women with baseline chronic pain and/or poorly controlled anxiety or depression. Approximately 2-4% of patients will experience longer-term post-operative voiding dysfunction that may require surgical revision of the sling. We also reviewed that postoperatively, her stream may not be as strong as before surgery.   Start gemtesa  for overactive bladder symptoms.

## 2025-01-21 NOTE — Assessment & Plan Note (Signed)
-   reduced to 1x/night - avoid fluid intake 3 hours before bedtime, difficulty due to dry mouth - continue diuretic (e.g. maxzide) after lunchtime - trial of Gemtesa  at bedtime

## 2025-01-21 NOTE — Assessment & Plan Note (Signed)
-   increased miralax every 3 days increased to 2 days (prior daily use with loose stool) - For constipation, we reviewed the importance of a better bowel regimen.  We also discussed the importance of avoiding chronic straining, as it can exacerbate her pelvic floor symptoms; we discussed treating constipation and straining prior to surgery, as postoperative straining can lead to damage to the repair and recurrence of symptoms. We discussed initiating therapy with increasing fluid intake, fiber supplementation, stool softeners, and laxatives such as miralax.  - encouraged to continue titration of miralax and squatting position for defecation to avoid straining due to risk of pessary expulsion - stopped trospium

## 2025-03-09 ENCOUNTER — Ambulatory Visit: Admitting: Obstetrics

## 2025-04-21 ENCOUNTER — Other Ambulatory Visit
